# Patient Record
Sex: Female | Born: 1970 | Race: White | Hispanic: No | Marital: Married | Smoking: Never smoker
Health system: Southern US, Community
[De-identification: ages and names within clinical notes are randomized; demographics above are authoritative.]

## PROBLEM LIST (undated history)

## (undated) DIAGNOSIS — J309 Allergic rhinitis, unspecified: Secondary | ICD-10-CM

## (undated) DIAGNOSIS — M2550 Pain in unspecified joint: Secondary | ICD-10-CM

## (undated) HISTORY — DX: Pain in unspecified joint: M25.50

## (undated) HISTORY — PX: TUBAL LIGATION: SHX77

## (undated) HISTORY — DX: Allergic rhinitis, unspecified: J30.9

## (undated) HISTORY — PX: BREAST REDUCTION SURGERY: SHX8

---

## 2011-03-16 ENCOUNTER — Emergency Department (HOSPITAL_COMMUNITY)
Admission: EM | Admit: 2011-03-16 | Discharge: 2011-03-16 | Disposition: A | Discharge status: Home / Self Care | Payer: Self-pay | Attending: Emergency Medicine | Admitting: Emergency Medicine

## 2011-03-16 ENCOUNTER — Encounter: Payer: Self-pay | Admitting: Emergency Medicine

## 2011-03-16 DIAGNOSIS — Z203 Contact with and (suspected) exposure to rabies: Secondary | ICD-10-CM | POA: Insufficient documentation

## 2011-03-16 MED ORDER — RABIES IMMUNE GLOBULIN 150 UNIT/ML IM INJ
20.0000 [IU]/kg | INJECTION | Freq: Once | INTRAMUSCULAR | Status: AC
  Administered 2011-03-16: 1500 [IU] via INTRAMUSCULAR

## 2011-03-16 MED ORDER — RABIES VACCINE, PCEC IM SUSR
1.0000 mL | Freq: Once | INTRAMUSCULAR | Status: AC
  Administered 2011-03-16: 1 mL via INTRAMUSCULAR

## 2011-03-16 NOTE — ED Notes (Signed)
Pt dog killed a rabid raccoon Saturday.

## 2011-03-19 ENCOUNTER — Encounter (HOSPITAL_COMMUNITY): Payer: Self-pay | Attending: Family Medicine

## 2011-03-19 DIAGNOSIS — Z203 Contact with and (suspected) exposure to rabies: Secondary | ICD-10-CM | POA: Insufficient documentation

## 2011-03-19 DIAGNOSIS — Z23 Encounter for immunization: Secondary | ICD-10-CM | POA: Insufficient documentation

## 2011-03-19 MED ORDER — RABIES VACCINE, PCEC IM SUSR
1.0000 mL | Freq: Once | INTRAMUSCULAR | Status: AC
  Administered 2011-03-19: 1 mL via INTRAMUSCULAR
  Filled 2011-03-19: qty 1

## 2011-03-19 NOTE — Progress Notes (Signed)
Rabivert 1 cc given IM Z-track lt deltoid. Tolerated well.

## 2011-03-22 NOTE — ED Provider Notes (Signed)
History     CSN: [id number] Arrival date & time: 03/16/2011  5:35 PM  Chief Complaint  Patient presents with  . Rabies Injection   HPI Comments: Patient and her son present today for rabies immunoglobulin and vaccine.  Their 2 dachsunds  Attacked a raccoon 3 days ago,  Midday sustaining lacerations and abrasions to face.  The raccoon has tested positive for rabies,  The dogs are also up to date on their vaccines. Both son and mother were exposed to blood and saliva from the dogs and/or raccoon.  They both deny any injury,  Such as punctures,  Abrasion or laceration.  They have no complaint of symptom and both feel well today.   The history is provided by the patient.    History reviewed. No pertinent past medical history.  Past Surgical History  Procedure Date  . Tubal ligation     History reviewed. No pertinent family history.  History  Substance Use Topics  . Smoking status: Never Smoker   . Smokeless tobacco: Not on file  . Alcohol Use: No    OB History    Grav Para Term Preterm Abortions TAB SAB Ect Mult Living                  Review of Systems  All other systems reviewed and are negative.    Physical Exam  BP 139/90  Pulse 80  Temp(Src) 99 F (37.2 C) (Oral)  Resp 18  Ht 5\' 8"  (1.727 m)  Wt 165 lb (74.844 kg)  BMI 25.09 kg/m2  SpO2 99%  LMP 03/09/2011  Physical Exam  Vitals reviewed. Constitutional: She is oriented to person, place, and time. She appears well-developed and well-nourished.  HENT:  Head: Normocephalic and atraumatic.  Eyes: Conjunctivae are normal.  Neck: Normal range of motion.  Cardiovascular: Normal rate and regular rhythm.   Pulmonary/Chest: Effort normal.  Musculoskeletal: Normal range of motion.  Neurological: She is alert and oriented to person, place, and time.  Skin: Skin is warm and dry.  Psychiatric: She has a normal mood and affect.    ED Course  Procedures  MDM Case discussed with Dr. Ignacia Palma,  Both patient and  mother started on rabies series including immunoglobulin and vaccine today.  Set up with short stay for remaining vaccine ,  Schedule given.    Medical screening examination/treatment/procedure(s) were performed by non-physician practitioner and as supervising physician I was immediately available for consultation/collaboration. Osvaldo Human, M.D.     Candis Musa, PA 03/22/11 1227  Carleene Cooper III, MD 03/22/11 2001

## 2011-03-23 ENCOUNTER — Encounter (HOSPITAL_COMMUNITY): Payer: Self-pay

## 2011-03-23 MED ORDER — RABIES VACCINE, PCEC IM SUSR
1.0000 mL | Freq: Once | INTRAMUSCULAR | Status: AC
  Administered 2011-03-23: 1 mL via INTRAMUSCULAR
  Filled 2011-03-23: qty 1

## 2011-03-30 ENCOUNTER — Ambulatory Visit (HOSPITAL_COMMUNITY): Payer: Self-pay

## 2011-03-31 ENCOUNTER — Encounter (HOSPITAL_COMMUNITY): Payer: Self-pay

## 2011-03-31 DIAGNOSIS — Z203 Contact with and (suspected) exposure to rabies: Secondary | ICD-10-CM

## 2011-03-31 MED ORDER — RABIES VACCINE, PCEC IM SUSR
1.0000 mL | Freq: Once | INTRAMUSCULAR | Status: AC
  Administered 2011-03-31: 1 mL via INTRAMUSCULAR
  Filled 2011-03-31: qty 1

## 2011-03-31 NOTE — Progress Notes (Signed)
Tolerated rabavert well.

## 2019-03-16 ENCOUNTER — Other Ambulatory Visit: Payer: Self-pay

## 2019-03-16 DIAGNOSIS — Z20822 Contact with and (suspected) exposure to covid-19: Secondary | ICD-10-CM

## 2019-03-18 LAB — NOVEL CORONAVIRUS, NAA: SARS-CoV-2, NAA: NOT DETECTED

## 2019-05-29 ENCOUNTER — Other Ambulatory Visit: Payer: Self-pay

## 2019-05-29 DIAGNOSIS — Z20822 Contact with and (suspected) exposure to covid-19: Secondary | ICD-10-CM

## 2019-05-31 ENCOUNTER — Telehealth: Payer: Self-pay | Admitting: Family Medicine

## 2019-05-31 LAB — NOVEL CORONAVIRUS, NAA: SARS-CoV-2, NAA: NOT DETECTED

## 2019-05-31 NOTE — Telephone Encounter (Signed)
Pt aware covid lab test negative, not detected °

## 2019-12-25 ENCOUNTER — Ambulatory Visit (INDEPENDENT_AMBULATORY_CARE_PROVIDER_SITE_OTHER): Payer: Self-pay | Admitting: Dermatology

## 2019-12-25 ENCOUNTER — Other Ambulatory Visit: Payer: Self-pay

## 2019-12-25 DIAGNOSIS — L988 Other specified disorders of the skin and subcutaneous tissue: Secondary | ICD-10-CM

## 2019-12-25 NOTE — Progress Notes (Signed)
   Follow-Up Visit   Subjective  Janet Nielsen is a 49 y.o. 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   The following portions of the chart were reviewed this encounter and updated as appropriate:  Allergies  Meds  Problems  Med Hx  Surg Hx  Fam Hx      Review of Systems:  No other skin or systemic complaints except as noted in HPI or Assessment and Plan.  Objective  Well appearing patient in no apparent distress; mood and affect are within normal limits.  A focused examination was performed including face. Relevant physical exam findings are noted in the Assessment and Plan.  Objective  face: Rhytides and volume loss.   Images                     Assessment & Plan  Elastosis of skin face  Botox 41.25 units injected as to: -Frown complex 22.5 units -Botox comma 1.25 units -Forehead 7.5 units -Crows feet 5 units each side for a total of 10units  Intralesional injection - face Location: Frown complex, forehead, crows feet, botox comma on Left  Informed consent: Discussed risks (infection, pain, bleeding, bruising, swelling, allergic reaction, paralysis of nearby muscles, eyelid droop, double vision, neck weakness, difficulty breathing, headache, undesirable cosmetic result, and need for additional treatment) and benefits of the procedure, as well as the alternatives.  Informed consent was obtained.  Preparation: The area was cleansed with alcohol.  Procedure Details:  Botox was injected into the dermis with a 30-gauge needle. Pressure applied to any bleeding. Ice packs offered for swelling.  Lot Number:  [id number] Expiration:  02/2022  Total Units Injected:  41.25  Plan: Patient was instructed to remain upright for 4 hours. Patient was instructed to avoid massaging the face and avoid vigorous exercise for the rest of the day. Tylenol may be used for headache.  Allow 2 weeks  before returning to clinic for additional dosing as needed. Patient will call for any problems.   Return in about 1 month (around 01/25/2020) for Botox.  I, Othelia Pulling, RMA, am acting as scribe for Sarina Ser, MD .  Documentation: I have reviewed the above documentation for accuracy and completeness, and I agree with the above.  Sarina Ser, MD

## 2020-01-04 ENCOUNTER — Encounter: Payer: Self-pay | Admitting: Dermatology

## 2020-04-15 ENCOUNTER — Ambulatory Visit (INDEPENDENT_AMBULATORY_CARE_PROVIDER_SITE_OTHER): Payer: Self-pay | Admitting: Dermatology

## 2020-04-15 ENCOUNTER — Other Ambulatory Visit: Payer: Self-pay

## 2020-04-15 DIAGNOSIS — L988 Other specified disorders of the skin and subcutaneous tissue: Secondary | ICD-10-CM

## 2020-04-15 NOTE — Progress Notes (Signed)
   Follow-Up Visit   Subjective  Janet Nielsen is a 49 y.o. female who presents for the following: Facial Elastosis (face, pt presents for Botox, last botox 12/25/2019).  The following portions of the chart were reviewed this encounter and updated as appropriate:  Allergies  Meds  Problems  Med Hx  Surg Hx  Fam Hx     Review of Systems:  No other skin or systemic complaints except as noted in HPI or Assessment and Plan.  Objective  Well appearing patient in no apparent distress; mood and affect are within normal limits.  A focused examination was performed including lower extremities, including the legs, feet, toes, and toenails and face. Relevant physical exam findings are noted in the Assessment and Plan.  Objective  face: Rhytides and volume loss.   Images     Assessment & Plan  Elastosis of skin face  Botox 41.25 units injected as to: -Frown complex 22.5 units -Botox comma 1.25 units -Forehead 7.5 units -Crows feet 5 units each side for a total of 10units  Will plan to increase frown complex to 30 units on f/u  Intralesional injection - face Location: Frown complex, forehead, botox comma, crows feet  Informed consent: Discussed risks (infection, pain, bleeding, bruising, swelling, allergic reaction, paralysis of nearby muscles, eyelid droop, double vision, neck weakness, difficulty breathing, headache, undesirable cosmetic result, and need for additional treatment) and benefits of the procedure, as well as the alternatives.  Informed consent was obtained.  Preparation: The area was cleansed with alcohol.  Procedure Details:  Botox was injected into the dermis with a 30-gauge needle. Pressure applied to any bleeding. Ice packs offered for swelling.  Lot Number:  [id number] Expiration:  06/2022  Total Units Injected:  41.25  Plan: Patient was instructed to remain upright for 4 hours. Patient was instructed to avoid massaging the face and avoid vigorous  exercise for the rest of the day. Tylenol may be used for headache.  Allow 2 weeks before returning to clinic for additional dosing as needed. Patient will call for any problems.   Return in about 3 months (around 07/15/2020) for 3-104m Botox.   I, Othelia Pulling, RMA, am acting as scribe for Sarina Ser, MD .  Documentation: I have reviewed the above documentation for accuracy and completeness, and I agree with the above.  Sarina Ser, MD

## 2020-04-19 ENCOUNTER — Encounter: Payer: Self-pay | Admitting: Dermatology

## 2020-06-08 ENCOUNTER — Other Ambulatory Visit: Payer: Self-pay

## 2020-06-08 ENCOUNTER — Encounter: Payer: Self-pay | Admitting: Emergency Medicine

## 2020-06-08 ENCOUNTER — Ambulatory Visit
Admission: EM | Admit: 2020-06-08 | Discharge: 2020-06-08 | Disposition: A | Discharge status: Home / Self Care | Payer: PRIVATE HEALTH INSURANCE | Attending: Emergency Medicine | Admitting: Emergency Medicine

## 2020-06-08 DIAGNOSIS — J029 Acute pharyngitis, unspecified: Secondary | ICD-10-CM | POA: Diagnosis not present

## 2020-06-08 LAB — POCT RAPID STREP A (OFFICE): Rapid Strep A Screen: NEGATIVE

## 2020-06-08 MED ORDER — LIDOCAINE VISCOUS HCL 2 % MT SOLN: 10.0000 mL | OROMUCOSAL | 1 refills | Status: DC | PRN

## 2020-06-08 NOTE — ED Triage Notes (Signed)
Patient states that her covid test was negative yesterday- sore throat hx of strep.

## 2020-06-08 NOTE — ED Provider Notes (Signed)
[place]   [id number] 06/08/20 Arrival Time: 1016  JE:HUDJ THROAT  SUBJECTIVE: History from: patient.  Janet Nielsen is a 49 y.o. female w who presented to the urgent care for complaint of sore throat for the past few days.  Reports history of strep.  States she has tested negative for COVID-19.  Denies sick exposure to strep, flu or mono, or precipitating event.  Has tried OTC medication without relief.  Symptoms are made worse with swallowing, but tolerating liquids and own secretions without difficulty.  Reports/ denies previous symptoms in the past.  Denies fever, chills, fatigue, ear pain, sinus pain, rhinorrhea, nasal congestion, cough, SOB, wheezing, chest pain, nausea, rash, changes in bowel or bladder habits.     ROS: As per HPI.  All other pertinent ROS negative.      History reviewed. No pertinent past medical history. Past Surgical History:  Procedure Laterality Date  . TUBAL LIGATION     No Known Allergies No current facility-administered medications on file prior to encounter.   Current Outpatient Medications on File Prior to Encounter  Medication Sig Dispense Refill  . ibuprofen (ADVIL,MOTRIN) 200 MG tablet Take 200-800 mg by mouth once as needed. For headache pain     . naproxen sodium (ANAPROX) 220 MG tablet Take 440 mg by mouth once as needed. For pain      Social History   Socioeconomic History  . Marital status: Married    Spouse name: Not on file  . Number of children: Not on file  . Years of education: Not on file  . Highest education level: Not on file  Occupational History  . Not on file  Tobacco Use  . Smoking status: Never Smoker  . Smokeless tobacco: Never Used  Substance and Sexual Activity  . Alcohol use: No  . Drug use: No  . Sexual activity: Not on file  Other Topics Concern  . Not on file  Social History Narrative  . Not on file   Social Determinants of Health   Financial Resource Strain:   . Difficulty of Paying  Living Expenses: Not on file  Food Insecurity:   . Worried About Charity fundraiser in the Last Year: Not on file  . Ran Out of Food in the Last Year: Not on file  Transportation Needs:   . Lack of Transportation (Medical): Not on file  . Lack of Transportation (Non-Medical): Not on file  Physical Activity:   . Days of Exercise per Week: Not on file  . Minutes of Exercise per Session: Not on file  Stress:   . Feeling of Stress : Not on file  Social Connections:   . Frequency of Communication with Friends and Family: Not on file  . Frequency of Social Gatherings with Friends and Family: Not on file  . Attends Religious Services: Not on file  . Active Member of Clubs or Organizations: Not on file  . Attends Archivist Meetings: Not on file  . Marital Status: Not on file  Intimate Partner Violence:   . Fear of Current or Ex-Partner: Not on file  . Emotionally Abused: Not on file  . Physically Abused: Not on file  . Sexually Abused: Not on file   No family history on file.  OBJECTIVE:  Vitals:   06/08/20 1034  BP: (!) 140/95  Pulse: 95  Resp: 16  Temp: 98.3 F (36.8 C)  SpO2: 99%     General appearance: alert; appears fatigued, but  nontoxic, speaking in full sentences and managing own secretions HEENT: NCAT; Ears: EACs clear, TMs pearly gray with visible cone of light, without erythema; Eyes: PERRL, EOMI grossly; Nose: no obvious rhinorrhea; Throat: oropharynx clear, tonsils 1+ and mildly erythematous without white tonsillar exudates, uvula midline Neck: supple without LAD Lungs: CTA bilaterally without adventitious breath sounds; cough absent Heart: regular rate and rhythm.  Radial pulses 2+ symmetrical bilaterally Skin: warm and dry Psychological: alert and cooperative; normal mood and affect  LABS: Results for orders placed or performed during the hospital encounter of 06/08/20 (from the past 24 hour(s))  POCT rapid strep A     Status: None   Collection  Time: 06/08/20 10:56 AM  Result Value Ref Range   Rapid Strep A Screen Negative Negative     ASSESSMENT & PLAN:  1. Sore throat     Meds ordered this encounter  Medications  . lidocaine (XYLOCAINE) 2 % solution    Sig: Use as directed 10 mLs in the mouth or throat as needed for mouth pain.    Dispense:  100 mL    Refill:  1   Discharge instructions  Strep test negative, will send out for culture and we will call you with results Get plenty of rest and push fluids Viscous lidocaine prescribed.  This is an oral solution you can swish, and gargle as needed for symptomatic relief of sore throat.  Do not exceed 8 doses in a 24 hour period.  Do not use prior to eating, as this will numb your entire mouth.   Drink warm or cool liquids, use throat lozenges, or popsicles to help alleviate symptoms Take OTC ibuprofen or tylenol as needed for pain Follow up with PCP if symptoms persists Return or go to ER if patient has any new or worsening symptoms such as fever, chills, nausea, vomiting, worsening sore throat, cough, abdominal pain, chest pain, changes in bowel or bladder habits, etc...  Reviewed expectations re: course of current medical issues. Questions answered. Outlined signs and symptoms indicating need for more acute intervention. Patient verbalized understanding. After Visit Summary given.         Emerson Monte, [place] 06/08/20 1116

## 2020-06-08 NOTE — Discharge Instructions (Addendum)
Strep test negative, will send out for culture and we will call you with results Get plenty of rest and push fluids Viscous lidocaine prescribed.  This is an oral solution you can swish, and gargle as needed for symptomatic relief of sore throat.  Do not exceed 8 doses in a 24 hour period.  Do not use prior to eating, as this will numb your entire mouth.   Drink warm or cool liquids, use throat lozenges, or popsicles to help alleviate symptoms Take OTC ibuprofen or tylenol as needed for pain Follow up with PCP if symptoms persists Return or go to ER if patient has any new or worsening symptoms such as fever, chills, nausea, vomiting, worsening sore throat, cough, abdominal pain, chest pain, changes in bowel or bladder habits, etc..Marland Kitchen

## 2020-06-09 LAB — CULTURE, GROUP A STREP (THRC)

## 2020-06-11 LAB — CULTURE, GROUP A STREP (THRC)

## 2020-08-19 ENCOUNTER — Ambulatory Visit: Payer: Self-pay | Admitting: Dermatology

## 2020-08-21 ENCOUNTER — Ambulatory Visit: Payer: Self-pay | Admitting: Dermatology

## 2020-09-09 ENCOUNTER — Ambulatory Visit (INDEPENDENT_AMBULATORY_CARE_PROVIDER_SITE_OTHER): Payer: Self-pay | Admitting: Dermatology

## 2020-09-09 ENCOUNTER — Other Ambulatory Visit: Payer: Self-pay

## 2020-09-09 DIAGNOSIS — L988 Other specified disorders of the skin and subcutaneous tissue: Secondary | ICD-10-CM

## 2020-09-09 NOTE — Progress Notes (Signed)
   Follow-Up Visit   Subjective  Janet Nielsen is a 50 y.o. female who presents for the following: Facial Elastosis (Patient is here today for Botox injections. She would also like to discuss treatment options for facial elastosis of the lower half of her face. ).  The following portions of the chart were reviewed this encounter and updated as appropriate:   Tobacco  Allergies  Meds  Problems  Med Hx  Surg Hx  Fam Hx     Review of Systems:  No other skin or systemic complaints except as noted in HPI or Assessment and Plan.  Objective  Well appearing patient in no apparent distress; mood and affect are within normal limits.  A focused examination was performed including the face. Relevant physical exam findings are noted in the Assessment and Plan.  Objective  Face: Rhytides and volume loss.   Images         Assessment & Plan  Elastosis of skin Face  Botox 49.25 units injected as marked:   - Frown complex 22.5 units - L Botox comma 1.25 units - Forehead 7.5 units  - Crow's feet 5 units each  - DAO's 4 units each   Consider increasing frown complex to 30 units.   Discussed fillers to the oral commissures and nasolabial creases.   Botox Injection - Face Location: See attached image  Informed consent: Discussed risks (infection, pain, bleeding, bruising, swelling, allergic reaction, paralysis of nearby muscles, eyelid droop, double vision, neck weakness, difficulty breathing, headache, undesirable cosmetic result, and need for additional treatment) and benefits of the procedure, as well as the alternatives.  Informed consent was obtained.  Preparation: The area was cleansed with alcohol.  Procedure Details:  Botox was injected into the dermis with a 30-gauge needle. Pressure applied to any bleeding. Ice packs offered for swelling.  Lot Number:  [id number] Expiration:  10/2022  Total Units Injected:  49.25  Plan: Patient was instructed to remain upright for  4 hours. Patient was instructed to avoid massaging the face and avoid vigorous exercise for the rest of the day. Tylenol may be used for headache.  Allow 2 weeks before returning to clinic for additional dosing as needed. Patient will call for any problems.   Return in about 3 months (around 12/08/2020) for cosmetic - Botox.  Luther Redo, CMA, am acting as scribe for Sarina Ser, MD .  Documentation: I have reviewed the above documentation for accuracy and completeness, and I agree with the above.  Sarina Ser, MD

## 2020-09-12 ENCOUNTER — Encounter: Payer: Self-pay | Admitting: Dermatology

## 2020-09-16 ENCOUNTER — Other Ambulatory Visit: Payer: PRIVATE HEALTH INSURANCE

## 2020-09-16 DIAGNOSIS — Z20822 Contact with and (suspected) exposure to covid-19: Secondary | ICD-10-CM

## 2020-09-17 LAB — SPECIMEN STATUS REPORT

## 2020-09-17 LAB — SARS-COV-2, NAA 2 DAY TAT

## 2020-09-17 LAB — NOVEL CORONAVIRUS, NAA: SARS-CoV-2, NAA: DETECTED — AB

## 2020-09-25 ENCOUNTER — Other Ambulatory Visit: Payer: Self-pay

## 2020-09-25 ENCOUNTER — Ambulatory Visit
Admission: EM | Admit: 2020-09-25 | Discharge: 2020-09-25 | Disposition: A | Discharge status: Home / Self Care | Payer: PRIVATE HEALTH INSURANCE | Attending: Family Medicine | Admitting: Family Medicine

## 2020-09-25 DIAGNOSIS — J029 Acute pharyngitis, unspecified: Secondary | ICD-10-CM | POA: Diagnosis not present

## 2020-09-25 LAB — POCT RAPID STREP A (OFFICE): Rapid Strep A Screen: NEGATIVE

## 2020-09-25 NOTE — Discharge Instructions (Addendum)
Your rapid strep test is negative.  A throat culture is pending; we will call you if it is positive requiring treatment.    Follow up with this office or with primary care if symptoms are persisting.  Follow up in the ER for high fever, trouble swallowing, trouble breathing, other concerning symptoms.

## 2020-09-25 NOTE — ED Triage Notes (Signed)
Pt presents with sore throat that began Tuesday , had covid over week ago , daughter now has strep

## 2020-09-25 NOTE — ED Provider Notes (Signed)
[place]   [id number] 09/25/20 Arrival Time: 7124  PY:KDXI THROAT  SUBJECTIVE: History from: patient.  Janet Nielsen is a 50 y.o. female who presents with abrupt onset of sore throat for the last 2 days. Was diagnosed with Covid 10 days ago. Denies sick exposure to Covid, strep, flu or mono, or precipitating event. Has tried tylenol and motrin without relief. Has not completed Covid vaccines. Symptoms are made worse with swallowing, but tolerating liquids and own secretions without difficulty. Denies previous symptoms in the past.     Denies fever, chills, fatigue, ear pain, sinus pain, rhinorrhea, nasal congestion, cough, SOB, wheezing, chest pain, nausea, rash, changes in bowel or bladder habits.    ROS: As per HPI.  All other pertinent ROS negative.     History reviewed. No pertinent past medical history. Past Surgical History:  Procedure Laterality Date  . TUBAL LIGATION     No Known Allergies No current facility-administered medications on file prior to encounter.   Current Outpatient Medications on File Prior to Encounter  Medication Sig Dispense Refill  . ibuprofen (ADVIL,MOTRIN) 200 MG tablet Take 200-800 mg by mouth once as needed. For headache pain     . lidocaine (XYLOCAINE) 2 % solution Use as directed 10 mLs in the mouth or throat as needed for mouth pain. 100 mL 1  . naproxen sodium (ANAPROX) 220 MG tablet Take 440 mg by mouth once as needed. For pain      Social History   Socioeconomic History  . Marital status: Married    Spouse name: Not on file  . Number of children: Not on file  . Years of education: Not on file  . Highest education level: Not on file  Occupational History  . Not on file  Tobacco Use  . Smoking status: Never Smoker  . Smokeless tobacco: Never Used  Substance and Sexual Activity  . Alcohol use: No  . Drug use: No  . Sexual activity: Not on file  Other Topics Concern  . Not on file  Social History Narrative  . Not on  file   Social Determinants of Health   Financial Resource Strain: Not on file  Food Insecurity: Not on file  Transportation Needs: Not on file  Physical Activity: Not on file  Stress: Not on file  Social Connections: Not on file  Intimate Partner Violence: Not on file   History reviewed. No pertinent family history.  OBJECTIVE:  Vitals:   09/25/20 1356  BP: 127/82  Pulse: 85  Resp: 18  Temp: 98.8 F (37.1 C)  SpO2: 98%     General appearance: alert; appears fatigued, but nontoxic, speaking in full sentences and managing own secretions HEENT: NCAT; Ears: EACs clear, TMs pearly gray with visible cone of light, without erythema; Eyes: PERRL, EOMI grossly; Nose: no obvious rhinorrhea; Throat: oropharynx clear, tonsils 1+ and mildly erythematous without white tonsillar exudates, uvula midline Neck: supple without LAD Lungs: CTA bilaterally without adventitious breath sounds; cough absent Heart: regular rate and rhythm.  Radial pulses 2+ symmetrical bilaterally Skin: warm and dry Psychological: alert and cooperative; normal mood and affect  LABS: Results for orders placed or performed during the hospital encounter of 09/25/20 (from the past 24 hour(s))  POCT rapid strep A     Status: None   Collection Time: 09/25/20  2:08 PM  Result Value Ref Range   Rapid Strep A Screen Negative Negative     ASSESSMENT & PLAN:  1. Viral pharyngitis  Strep test negative, will send out for culture and we will call you with results Declines test for mono at this time Get plenty of rest and push fluids Take OTC Zyrtec and use chloraseptic spray as needed for throat pain. Drink warm or cool liquids, use throat lozenges, or popsicles to help alleviate symptoms Take OTC ibuprofen or tylenol as needed for pain Follow up with PCP if symptoms persists Return or go to ER if patient has any new or worsening symptoms such as fever, chills, nausea, vomiting, worsening sore throat, cough, abdominal  pain, chest pain, changes in bowel or bladder habits  Reviewed expectations re: course of current medical issues. Questions answered. Outlined signs and symptoms indicating need for more acute intervention. Patient verbalized understanding. After Visit Summary given.          Faustino Congress, NP 09/25/20 1446

## 2020-09-26 LAB — CULTURE, GROUP A STREP (THRC)

## 2020-09-28 LAB — CULTURE, GROUP A STREP (THRC)

## 2020-10-01 ENCOUNTER — Other Ambulatory Visit (HOSPITAL_COMMUNITY): Payer: Self-pay | Admitting: Family Medicine

## 2020-10-01 ENCOUNTER — Other Ambulatory Visit: Payer: Self-pay

## 2020-10-01 ENCOUNTER — Ambulatory Visit (HOSPITAL_COMMUNITY)
Admission: RE | Admit: 2020-10-01 | Discharge: 2020-10-01 | Disposition: A | Discharge status: Home / Self Care | Payer: PRIVATE HEALTH INSURANCE | Source: Ambulatory Visit | Attending: Family Medicine | Admitting: Family Medicine

## 2020-10-01 DIAGNOSIS — M25552 Pain in left hip: Secondary | ICD-10-CM | POA: Insufficient documentation

## 2020-12-16 ENCOUNTER — Ambulatory Visit (INDEPENDENT_AMBULATORY_CARE_PROVIDER_SITE_OTHER): Payer: Self-pay | Admitting: Dermatology

## 2020-12-16 ENCOUNTER — Other Ambulatory Visit: Payer: Self-pay

## 2020-12-16 DIAGNOSIS — L988 Other specified disorders of the skin and subcutaneous tissue: Secondary | ICD-10-CM

## 2020-12-16 NOTE — Progress Notes (Signed)
   Follow-Up Visit   Subjective  Janet Nielsen is a 50 y.o. female who presents for the following: Facial Elastosis (Botox today).  The following portions of the chart were reviewed this encounter and updated as appropriate:   Tobacco  Allergies  Meds  Problems  Med Hx  Surg Hx  Fam Hx     Review of Systems:  No other skin or systemic complaints except as noted in HPI or Assessment and Plan.  Objective  Well appearing patient in no apparent distress; mood and affect are within normal limits.  A focused examination was performed including face. Relevant physical exam findings are noted in the Assessment and Plan.  Objective  Face: Rhytides and volume loss.   Images     Assessment & Plan  Elastosis of skin Face  Botox 56.75 units injected as marked:    - Frown complex 30 units - L Botox comma 1.25 units - Forehead 7.5 units  - Crow's feet 5 units each  - DAO's 4 units each   Botox Injection - Face Location: See attached image  Informed consent: Discussed risks (infection, pain, bleeding, bruising, swelling, allergic reaction, paralysis of nearby muscles, eyelid droop, double vision, neck weakness, difficulty breathing, headache, undesirable cosmetic result, and need for additional treatment) and benefits of the procedure, as well as the alternatives.  Informed consent was obtained.  Preparation: The area was cleansed with alcohol.  Procedure Details:  Botox was injected into the dermis with a 30-gauge needle. Pressure applied to any bleeding. Ice packs offered for swelling.  Lot Number:  [id number] Expiration:  01/2023  Total Units Injected:  56.75  Plan: Patient was instructed to remain upright for 4 hours. Patient was instructed to avoid massaging the face and avoid vigorous exercise for the rest of the day. Tylenol may be used for headache.  Allow 2 weeks before returning to clinic for additional dosing as needed. Patient will call for any  problems.   Return for Botox in 3-4 months.  I, Ashok Cordia, CMA, am acting as scribe for Sarina Ser, MD .  Documentation: I have reviewed the above documentation for accuracy and completeness, and I agree with the above.  Sarina Ser, MD

## 2020-12-18 ENCOUNTER — Encounter: Payer: Self-pay | Admitting: Dermatology

## 2021-03-19 ENCOUNTER — Ambulatory Visit
Admission: EM | Admit: 2021-03-19 | Discharge: 2021-03-19 | Disposition: A | Discharge status: Home / Self Care | Payer: No Typology Code available for payment source | Attending: Emergency Medicine | Admitting: Emergency Medicine

## 2021-03-19 ENCOUNTER — Encounter: Payer: Self-pay | Admitting: Emergency Medicine

## 2021-03-19 DIAGNOSIS — J040 Acute laryngitis: Secondary | ICD-10-CM | POA: Diagnosis not present

## 2021-03-19 MED ORDER — PREDNISONE 20 MG PO TABS: 20.0000 mg | ORAL_TABLET | Freq: Two times a day (BID) | ORAL | 0 refills | Status: AC

## 2021-03-19 NOTE — Discharge Instructions (Addendum)
COVID testing ordered.  It will take between 5-7 days for test results.  Someone will contact you regarding abnormal results.    In the meantime: You should remain isolated in your home for 5 days from symptom onset AND greater than 72 hours after symptoms resolution (absence of fever without the use of fever-reducing medication and improvement in respiratory symptoms), whichever is longer Get plenty of rest and push fluids Prednisone prescribed.  If needed for laryngitis  Use OTC zyrtec for nasal congestion, runny nose, and/or sore throat Use OTC flonase for nasal congestion and runny nose Use medications daily for symptom relief Use OTC medications like ibuprofen or tylenol as needed fever or pain Call or go to the ED if you have any new or worsening symptoms such as fever, cough, shortness of breath, chest tightness, chest pain, turning blue, changes in mental status, etc..Marland Kitchen

## 2021-03-19 NOTE — ED Provider Notes (Signed)
[place]   [id number] 03/19/21 Arrival Time: 0800   CC: COVID symptoms  SUBJECTIVE: History from: patient.  Janet Nielsen is a 50 y.o. female who presents with hoarse voice and sinus congestion that began this morning.  Husband recently diagnosed with bronchitis and sinus infection.  Took at home covid test that was negative.  Has NOT tried OTC medications.  Symptoms are made worse with speaking.  Reports previous symptoms in the past.   Denies fever, chills, sinus pain, rhinorrhea, sore throat, SOB, wheezing, chest pain, nausea, changes in bowel or bladder habits.    ROS: As per HPI.  All other pertinent ROS negative.     History reviewed. No pertinent past medical history. Past Surgical History:  Procedure Laterality Date   TUBAL LIGATION     No Known Allergies No current facility-administered medications on file prior to encounter.   Current Outpatient Medications on File Prior to Encounter  Medication Sig Dispense Refill   ibuprofen (ADVIL,MOTRIN) 200 MG tablet Take 200-800 mg by mouth once as needed. For headache pain      lidocaine (XYLOCAINE) 2 % solution Use as directed 10 mLs in the mouth or throat as needed for mouth pain. 100 mL 1   naproxen sodium (ANAPROX) 220 MG tablet Take 440 mg by mouth once as needed. For pain      Social History   Socioeconomic History   Marital status: Married    Spouse name: Not on file   Number of children: Not on file   Years of education: Not on file   Highest education level: Not on file  Occupational History   Not on file  Tobacco Use   Smoking status: Never   Smokeless tobacco: Never  Substance and Sexual Activity   Alcohol use: No   Drug use: No   Sexual activity: Not on file  Other Topics Concern   Not on file  Social History Narrative   Not on file   Social Determinants of Health   Financial Resource Strain: Not on file  Food Insecurity: Not on file  Transportation Needs: Not on file  Physical  Activity: Not on file  Stress: Not on file  Social Connections: Not on file  Intimate Partner Violence: Not on file   No family history on file.  OBJECTIVE:  Vitals:   03/19/21 0821  BP: 130/85  Pulse: 78  Resp: 17  Temp: 98.2 F (36.8 C)  TempSrc: Oral  SpO2: 98%     General appearance: alert; appears fatigued, but nontoxic; speaking in full sentences and tolerating own secretions; voice hoarse HEENT: NCAT; Ears: EACs clear, TMs pearly gray; Eyes: PERRL.  EOM grossly intact. Nose: nares patent without rhinorrhea, Throat: oropharynx clear, tonsils non erythematous or enlarged, uvula midline  Neck: supple without LAD Lungs: unlabored respirations, symmetrical air entry; cough: absent; no respiratory distress; CTAB Heart: regular rate and rhythm.   Skin: warm and dry Psychological: alert and cooperative; normal mood and affect  ASSESSMENT & PLAN:  1. Laryngitis     Meds ordered this encounter  Medications   predniSONE (DELTASONE) 20 MG tablet    Sig: Take 1 tablet (20 mg total) by mouth 2 (two) times daily with a meal for 5 days.    Dispense:  10 tablet    Refill:  0    Order Specific Question:   Supervising Provider    Answer:   Raylene Everts [id number]    COVID testing ordered.  It will  take between 5-7 days for test results.  Someone will contact you regarding abnormal results.    In the meantime: You should remain isolated in your home for 5 days from symptom onset AND greater than 72 hours after symptoms resolution (absence of fever without the use of fever-reducing medication and improvement in respiratory symptoms), whichever is longer Get plenty of rest and push fluids Prednisone prescribed.  If needed for laryngitis  Use OTC zyrtec for nasal congestion, runny nose, and/or sore throat Use OTC flonase for nasal congestion and runny nose Use medications daily for symptom relief Use OTC medications like ibuprofen or tylenol as needed fever or pain Call or  go to the ED if you have any new or worsening symptoms such as fever, cough, shortness of breath, chest tightness, chest pain, turning blue, changes in mental status, etc...   Reviewed expectations re: course of current medical issues. Questions answered. Outlined signs and symptoms indicating need for more acute intervention. Patient verbalized understanding. After Visit Summary given.          Lestine Box, PA-C 03/19/21 0840

## 2021-03-19 NOTE — ED Triage Notes (Signed)
Pt states she woke up this morning and had lost her voice.  Had green mucous when blowing her nose.  Neg at home covid test.

## 2021-03-20 LAB — COVID-19, FLU A+B NAA
Influenza A, NAA: NOT DETECTED
Influenza B, NAA: NOT DETECTED
SARS-CoV-2, NAA: NOT DETECTED

## 2021-03-31 ENCOUNTER — Ambulatory Visit: Payer: Self-pay | Admitting: Dermatology

## 2021-03-31 ENCOUNTER — Other Ambulatory Visit: Payer: Self-pay

## 2021-03-31 ENCOUNTER — Ambulatory Visit (INDEPENDENT_AMBULATORY_CARE_PROVIDER_SITE_OTHER): Payer: Self-pay | Admitting: Dermatology

## 2021-03-31 DIAGNOSIS — L988 Other specified disorders of the skin and subcutaneous tissue: Secondary | ICD-10-CM

## 2021-03-31 NOTE — Patient Instructions (Signed)

## 2021-03-31 NOTE — Progress Notes (Signed)
   Follow-Up Visit   Subjective  Janet Nielsen is a 50 y.o. female who presents for the following: Facial Elastosis (Botox today).  The following portions of the chart were reviewed this encounter and updated as appropriate:   Tobacco  Allergies  Meds  Problems  Med Hx  Surg Hx  Fam Hx     Review of Systems:  No other skin or systemic complaints except as noted in HPI or Assessment and Plan.  Objective  Well appearing patient in no apparent distress; mood and affect are within normal limits.  A focused examination was performed including face. Relevant physical exam findings are noted in the Assessment and Plan.  Head - Anterior (Face) Rhytides and volume loss.           Assessment & Plan  Elastosis of skin Head - Anterior (Face)  Botox today - 53.75 units  Frown Complex 30 units Forehead 7.5 units Left botox comma 1.25 units Crows Feet 7.5 units each side (1+ units inf to each lat canthus)   Filling material injection - Head - Anterior (Face) Location: See attached image  Informed consent: Discussed risks (infection, pain, bleeding, bruising, swelling, allergic reaction, paralysis of nearby muscles, eyelid droop, double vision, neck weakness, difficulty breathing, headache, undesirable cosmetic result, and need for additional treatment) and benefits of the procedure, as well as the alternatives.  Informed consent was obtained.  Preparation: The area was cleansed with alcohol.  Procedure Details:  Botox was injected into the dermis with a 30-gauge needle. Pressure applied to any bleeding. Ice packs offered for swelling.  Lot Number:  [id number] Expiration:  01/2023  Total Units Injected:  53.75  Plan: Patient was instructed to remain upright for 4 hours. Patient was instructed to avoid massaging the face and avoid vigorous exercise for the rest of the day. Tylenol may be used for headache.  Allow 2 weeks before returning to clinic for additional dosing as  needed. Patient will call for any problems.   Return for Botox in 3-4 months.  I, Ashok Cordia, CMA, am acting as scribe for Sarina Ser, MD . Documentation: I have reviewed the above documentation for accuracy and completeness, and I agree with the above.  Sarina Ser, MD

## 2021-04-05 ENCOUNTER — Encounter: Payer: Self-pay | Admitting: Dermatology

## 2021-05-20 ENCOUNTER — Encounter: Payer: Self-pay | Admitting: Internal Medicine

## 2021-06-02 ENCOUNTER — Other Ambulatory Visit (HOSPITAL_COMMUNITY): Payer: Self-pay

## 2021-06-02 MED ORDER — INFLUENZA VAC SPLIT QUAD 0.5 ML IM SUSY
PREFILLED_SYRINGE | INTRAMUSCULAR | 0 refills | Status: DC
  Filled 2021-06-02: qty 0.5, 1d supply, fill #0

## 2021-06-03 ENCOUNTER — Other Ambulatory Visit (HOSPITAL_COMMUNITY): Payer: Self-pay

## 2021-06-03 ENCOUNTER — Telehealth: Payer: Self-pay | Admitting: Internal Medicine

## 2021-06-03 NOTE — Telephone Encounter (Signed)
You are correct!  Yes, please.

## 2021-06-03 NOTE — Telephone Encounter (Signed)
Pt is scheduled a nurse visit for 11/15 at 2pm. She called today asking to move appointment up. She said that Sunday she started bleeding and thinks it's a hemorrhoid. Should I cancel this NV and put her on the schedule to be seen by provider? Please advise. [phone]

## 2021-06-10 ENCOUNTER — Other Ambulatory Visit: Payer: Self-pay

## 2021-06-10 ENCOUNTER — Ambulatory Visit (INDEPENDENT_AMBULATORY_CARE_PROVIDER_SITE_OTHER): Payer: No Typology Code available for payment source | Admitting: Internal Medicine

## 2021-06-10 ENCOUNTER — Encounter: Payer: Self-pay | Admitting: Internal Medicine

## 2021-06-10 VITALS — BP 141/94 | HR 72 | Temp 97.7°F | Ht 67.0 in | Wt 166.4 lb

## 2021-06-10 DIAGNOSIS — K6289 Other specified diseases of anus and rectum: Secondary | ICD-10-CM | POA: Diagnosis not present

## 2021-06-10 DIAGNOSIS — K625 Hemorrhage of anus and rectum: Secondary | ICD-10-CM

## 2021-06-10 MED ORDER — PEG 3350-KCL-NA BICARB-NACL 420 G PO SOLR: 4000.0000 mL | ORAL | 0 refills | Status: DC

## 2021-06-10 NOTE — Patient Instructions (Signed)
We will schedule you for colonoscopy to further evaluate your rectal bleeding as well as for colon cancer screening purposes.  If hemorrhoids are identified as the primary source of your bleeding, we can discuss treatment options.  It was very nice meeting you today.  Dr. Abbey Chatters  At [facility] we value your feedback. You may receive a survey about your visit today. Please share your experience as we strive to create trusting relationships with our patients to provide genuine, compassionate, quality care.  We appreciate your understanding and patience as we review any laboratory studies, imaging, and other diagnostic tests that are ordered as we care for you. Our office policy is 5 business days for review of these results, and any emergent or urgent results are addressed in a timely manner for your best interest. If you do not hear from our office in 1 week, please contact us.   We also encourage the use of MyChart, which contains your medical information for your review as well. If you are not enrolled in this feature, an access code is on this after visit summary for your convenience. Thank you for allowing [place] to be involved in your care.  It was great to see you today!  I hope you have a great rest of your Fall!    Elon Alas. Abbey Chatters, D.O. Gastroenterology and Hepatology [facility]

## 2021-06-10 NOTE — H&P (View-Only) (Signed)
Primary Care Physician:  Sharilyn Sites, MD Primary Gastroenterologist:  Dr. Abbey Chatters  Chief Complaint  Patient presents with   Rectal Bleeding    Never had tcs   Hemorrhoids    Had bleeding for 10 days    HPI:   Janet Nielsen is a 50 y.o. female who presents to the clinic today by referral from her PCP Dr. Hilma Favors for evaluation.  Patient states approximately 2 to 3 weeks ago she had sudden onset of rectal discomfort followed by profuse rectal bleeding.  States when it started there was a large amount of bright red blood.  No melena.  No clots.  This continued for about 10 days and finally ceased.  Today she states she is having very minimal bleeding.  Rectal pain is also improved.  No previous colonoscopy.  No abdominal pain.  No unintentional weight loss.  No family history of colorectal malignancy.  Denies any upper GI symptoms including heartburn, reflux, odynophagia, dysphagia, epigastric/chest pain.  Past Medical History:  Diagnosis Date   Allergic rhinitis    Joint pain     Past Surgical History:  Procedure Laterality Date   BREAST REDUCTION SURGERY     TUBAL LIGATION      Current Outpatient Medications  Medication Sig Dispense Refill   celecoxib (CELEBREX) 200 MG capsule Take 1-2 capsules by mouth as needed.     ibuprofen (ADVIL,MOTRIN) 200 MG tablet Take 200-800 mg by mouth once as needed. For headache pain      valACYclovir (VALTREX) 500 MG tablet Take 1,000 mg by mouth as needed.     influenza vac split quadrivalent PF (FLUARIX) 0.5 ML injection Inject into the muscle. (Patient not taking: Reported on 06/10/2021) 0.5 mL 0   lidocaine (XYLOCAINE) 2 % solution Use as directed 10 mLs in the mouth or throat as needed for mouth pain. (Patient not taking: Reported on 06/10/2021) 100 mL 1   naproxen sodium (ANAPROX) 220 MG tablet Take 440 mg by mouth once as needed. For pain  (Patient not taking: Reported on 06/10/2021)     No current facility-administered medications  for this visit.    Allergies as of 06/10/2021   (No Known Allergies)    Family History  Problem Relation Age of Onset   High Cholesterol Mother    High Cholesterol Sister     Social History   Socioeconomic History   Marital status: Married    Spouse name: Not on file   Number of children: Not on file   Years of education: Not on file   Highest education level: Not on file  Occupational History   Not on file  Tobacco Use   Smoking status: Never   Smokeless tobacco: Never  Substance and Sexual Activity   Alcohol use: Yes    Comment: every weekend   Drug use: No   Sexual activity: Not on file  Other Topics Concern   Not on file  Social History Narrative   Not on file   Social Determinants of Health   Financial Resource Strain: Not on file  Food Insecurity: Not on file  Transportation Needs: Not on file  Physical Activity: Not on file  Stress: Not on file  Social Connections: Not on file  Intimate Partner Violence: Not on file    Subjective: Review of Systems  Constitutional:  Negative for chills and fever.  HENT:  Negative for congestion and hearing loss.   Eyes:  Negative for blurred vision and double vision.  Respiratory:  Negative for cough and shortness of breath.   Cardiovascular:  Negative for chest pain and palpitations.  Gastrointestinal:  Positive for blood in stool. Negative for abdominal pain, constipation, diarrhea, heartburn, melena and vomiting.  Genitourinary:  Negative for dysuria and urgency.  Musculoskeletal:  Negative for joint pain and myalgias.  Skin:  Negative for itching and rash.  Neurological:  Negative for dizziness and headaches.  Psychiatric/Behavioral:  Negative for depression. The patient is not nervous/anxious.       Objective: BP (!) 141/94   Pulse 72   Temp 97.7 F (36.5 C) (Temporal)   Ht 5\' 7"  (1.702 m)   Wt 166 lb 6.4 oz (75.5 kg)   LMP 03/09/2011   BMI 26.06 kg/m  Physical Exam Constitutional:       Appearance: Normal appearance.  HENT:     Head: Normocephalic and atraumatic.  Eyes:     Extraocular Movements: Extraocular movements intact.     Conjunctiva/sclera: Conjunctivae normal.  Cardiovascular:     Rate and Rhythm: Normal rate and regular rhythm.  Pulmonary:     Effort: Pulmonary effort is normal.     Breath sounds: Normal breath sounds.  Abdominal:     General: Bowel sounds are normal.     Palpations: Abdomen is soft.  Musculoskeletal:        General: No swelling. Normal range of motion.     Cervical back: Normal range of motion and neck supple.  Skin:    General: Skin is warm and dry.     Coloration: Skin is not jaundiced.  Neurological:     General: No focal deficit present.     Mental Status: She is alert and oriented to person, place, and time.  Psychiatric:        Mood and Affect: Mood normal.        Behavior: Behavior normal.  Rectal exam deferred by patient until time of colonoscopy.   Assessment: *Rectal bleeding *Rectal pain  Plan: Will schedule for diagnostic colonoscopy.The risks including infection, bleed, or perforation as well as benefits, limitations, alternatives and imponderables have been reviewed with the patient. Questions have been answered. All parties agreeable.  If hemorrhoids identified as source of bleeding, can consider hemorrhoid banding in the outpatient clinic.  Thank you Dr. Hilma Favors for the kind referral  06/10/2021 4:21 PM   Disclaimer: This note was dictated with voice recognition software. Similar sounding words can inadvertently be transcribed and may not be corrected upon review.

## 2021-06-10 NOTE — Progress Notes (Signed)
Primary Care Physician:  Sharilyn Sites, MD Primary Gastroenterologist:  Dr. Abbey Chatters  Chief Complaint  Patient presents with   Rectal Bleeding    Never had tcs   Hemorrhoids    Had bleeding for 10 days    HPI:   Janet Nielsen is a 50 y.o. female who presents to the clinic today by referral from her PCP Dr. Hilma Favors for evaluation.  Patient states approximately 2 to 3 weeks ago she had sudden onset of rectal discomfort followed by profuse rectal bleeding.  States when it started there was a large amount of bright red blood.  No melena.  No clots.  This continued for about 10 days and finally ceased.  Today she states she is having very minimal bleeding.  Rectal pain is also improved.  No previous colonoscopy.  No abdominal pain.  No unintentional weight loss.  No family history of colorectal malignancy.  Denies any upper GI symptoms including heartburn, reflux, odynophagia, dysphagia, epigastric/chest pain.  Past Medical History:  Diagnosis Date   Allergic rhinitis    Joint pain     Past Surgical History:  Procedure Laterality Date   BREAST REDUCTION SURGERY     TUBAL LIGATION      Current Outpatient Medications  Medication Sig Dispense Refill   celecoxib (CELEBREX) 200 MG capsule Take 1-2 capsules by mouth as needed.     ibuprofen (ADVIL,MOTRIN) 200 MG tablet Take 200-800 mg by mouth once as needed. For headache pain      valACYclovir (VALTREX) 500 MG tablet Take 1,000 mg by mouth as needed.     influenza vac split quadrivalent PF (FLUARIX) 0.5 ML injection Inject into the muscle. (Patient not taking: Reported on 06/10/2021) 0.5 mL 0   lidocaine (XYLOCAINE) 2 % solution Use as directed 10 mLs in the mouth or throat as needed for mouth pain. (Patient not taking: Reported on 06/10/2021) 100 mL 1   naproxen sodium (ANAPROX) 220 MG tablet Take 440 mg by mouth once as needed. For pain  (Patient not taking: Reported on 06/10/2021)     No current facility-administered medications  for this visit.    Allergies as of 06/10/2021   (No Known Allergies)    Family History  Problem Relation Age of Onset   High Cholesterol Mother    High Cholesterol Sister     Social History   Socioeconomic History   Marital status: Married    Spouse name: Not on file   Number of children: Not on file   Years of education: Not on file   Highest education level: Not on file  Occupational History   Not on file  Tobacco Use   Smoking status: Never   Smokeless tobacco: Never  Substance and Sexual Activity   Alcohol use: Yes    Comment: every weekend   Drug use: No   Sexual activity: Not on file  Other Topics Concern   Not on file  Social History Narrative   Not on file   Social Determinants of Health   Financial Resource Strain: Not on file  Food Insecurity: Not on file  Transportation Needs: Not on file  Physical Activity: Not on file  Stress: Not on file  Social Connections: Not on file  Intimate Partner Violence: Not on file    Subjective: Review of Systems  Constitutional:  Negative for chills and fever.  HENT:  Negative for congestion and hearing loss.   Eyes:  Negative for blurred vision and double vision.  Respiratory:  Negative for cough and shortness of breath.   Cardiovascular:  Negative for chest pain and palpitations.  Gastrointestinal:  Positive for blood in stool. Negative for abdominal pain, constipation, diarrhea, heartburn, melena and vomiting.  Genitourinary:  Negative for dysuria and urgency.  Musculoskeletal:  Negative for joint pain and myalgias.  Skin:  Negative for itching and rash.  Neurological:  Negative for dizziness and headaches.  Psychiatric/Behavioral:  Negative for depression. The patient is not nervous/anxious.       Objective: BP (!) 141/94   Pulse 72   Temp 97.7 F (36.5 C) (Temporal)   Ht 5\' 7"  (1.702 m)   Wt 166 lb 6.4 oz (75.5 kg)   LMP 03/09/2011   BMI 26.06 kg/m  Physical Exam Constitutional:       Appearance: Normal appearance.  HENT:     Head: Normocephalic and atraumatic.  Eyes:     Extraocular Movements: Extraocular movements intact.     Conjunctiva/sclera: Conjunctivae normal.  Cardiovascular:     Rate and Rhythm: Normal rate and regular rhythm.  Pulmonary:     Effort: Pulmonary effort is normal.     Breath sounds: Normal breath sounds.  Abdominal:     General: Bowel sounds are normal.     Palpations: Abdomen is soft.  Musculoskeletal:        General: No swelling. Normal range of motion.     Cervical back: Normal range of motion and neck supple.  Skin:    General: Skin is warm and dry.     Coloration: Skin is not jaundiced.  Neurological:     General: No focal deficit present.     Mental Status: She is alert and oriented to person, place, and time.  Psychiatric:        Mood and Affect: Mood normal.        Behavior: Behavior normal.  Rectal exam deferred by patient until time of colonoscopy.   Assessment: *Rectal bleeding *Rectal pain  Plan: Will schedule for diagnostic colonoscopy.The risks including infection, bleed, or perforation as well as benefits, limitations, alternatives and imponderables have been reviewed with the patient. Questions have been answered. All parties agreeable.  If hemorrhoids identified as source of bleeding, can consider hemorrhoid banding in the outpatient clinic.  Thank you Dr. Hilma Favors for the kind referral  06/10/2021 4:21 PM   Disclaimer: This note was dictated with voice recognition software. Similar sounding words can inadvertently be transcribed and may not be corrected upon review.

## 2021-06-11 ENCOUNTER — Telehealth: Payer: Self-pay

## 2021-06-11 ENCOUNTER — Other Ambulatory Visit: Payer: Self-pay

## 2021-06-11 NOTE — Telephone Encounter (Signed)
Called [facility], spoke to [facility], no PA needed for TCS. Ref# [id number].

## 2021-06-13 ENCOUNTER — Ambulatory Visit
Admission: EM | Admit: 2021-06-13 | Discharge: 2021-06-13 | Disposition: A | Discharge status: Home / Self Care | Payer: No Typology Code available for payment source | Attending: Urgent Care | Admitting: Urgent Care

## 2021-06-13 ENCOUNTER — Other Ambulatory Visit: Payer: Self-pay

## 2021-06-13 ENCOUNTER — Encounter: Payer: Self-pay | Admitting: Emergency Medicine

## 2021-06-13 DIAGNOSIS — J018 Other acute sinusitis: Secondary | ICD-10-CM

## 2021-06-13 DIAGNOSIS — R0981 Nasal congestion: Secondary | ICD-10-CM

## 2021-06-13 DIAGNOSIS — R051 Acute cough: Secondary | ICD-10-CM

## 2021-06-13 LAB — POCT INFLUENZA A/B
Influenza A, POC: NEGATIVE
Influenza B, POC: NEGATIVE

## 2021-06-13 MED ORDER — CETIRIZINE HCL 10 MG PO TABS: 10.0000 mg | ORAL_TABLET | Freq: Every day | ORAL | 0 refills | Status: DC

## 2021-06-13 MED ORDER — PSEUDOEPHEDRINE HCL 30 MG PO TABS: 30.0000 mg | ORAL_TABLET | Freq: Three times a day (TID) | ORAL | 0 refills | Status: DC | PRN

## 2021-06-13 MED ORDER — AMOXICILLIN 875 MG PO TABS: 875.0000 mg | ORAL_TABLET | Freq: Two times a day (BID) | ORAL | 0 refills | Status: DC

## 2021-06-13 NOTE — ED Provider Notes (Signed)
[place]   MRN: [id number] DOB: 30-Aug-1970  Subjective:   Janet Nielsen is a 50 y.o. female presenting for 1 week history of persistent and worsening sinus congestion, sinus pressure, postnasal drainage, throat pain, coughing.  Patient has been using pseudoephedrine, over-the-counter medications with minimal relief.  No chest pain, shortness of breath, body aches.  Her husband wanted to have her checked for flu.  Patient is not a smoker, no history of asthma or respiratory disorders.  No current facility-administered medications for this encounter.  Current Outpatient Medications:    celecoxib (CELEBREX) 200 MG capsule, Take 1-2 capsules by mouth as needed., Disp: , Rfl:    ibuprofen (ADVIL,MOTRIN) 200 MG tablet, Take 200-800 mg by mouth once as needed. For headache pain , Disp: , Rfl:    influenza vac split quadrivalent PF (FLUARIX) 0.5 ML injection, Inject into the muscle. (Patient not taking: Reported on 06/10/2021), Disp: 0.5 mL, Rfl: 0   lidocaine (XYLOCAINE) 2 % solution, Use as directed 10 mLs in the mouth or throat as needed for mouth pain. (Patient not taking: Reported on 06/10/2021), Disp: 100 mL, Rfl: 1   naproxen sodium (ANAPROX) 220 MG tablet, Take 440 mg by mouth once as needed. For pain  (Patient not taking: Reported on 06/10/2021), Disp: , Rfl:    polyethylene glycol-electrolytes (TRILYTE) 420 g solution, Take 4,000 mLs by mouth as directed., Disp: 4000 mL, Rfl: 0   valACYclovir (VALTREX) 500 MG tablet, Take 1,000 mg by mouth as needed., Disp: , Rfl:    No Known Allergies  Past Medical History:  Diagnosis Date   Allergic rhinitis    Joint pain      Past Surgical History:  Procedure Laterality Date   BREAST REDUCTION SURGERY     TUBAL LIGATION      Family History  Problem Relation Age of Onset   High Cholesterol Mother    High Cholesterol Sister     Social History   Tobacco Use   Smoking status: Never   Smokeless tobacco: Never   Substance Use Topics   Alcohol use: Yes    Comment: every weekend   Drug use: No    ROS   Objective:   Vitals: BP (!) 147/89 (BP Location: Right Arm)   Pulse 82   Temp 98.4 F (36.9 C) (Oral)   Resp 18   LMP 03/09/2011   SpO2 98%   Physical Exam Constitutional:      General: She is not in acute distress.    Appearance: Normal appearance. She is well-developed. She is not ill-appearing, toxic-appearing or diaphoretic.  HENT:     Head: Normocephalic and atraumatic.     Right Ear: Tympanic membrane and ear canal normal. No drainage or tenderness. No middle ear effusion. Tympanic membrane is not erythematous.     Left Ear: Tympanic membrane and ear canal normal. No drainage or tenderness.  No middle ear effusion. Tympanic membrane is not erythematous.     Nose: Nose normal. No congestion or rhinorrhea.     Mouth/Throat:     Mouth: Mucous membranes are moist. No oral lesions.     Pharynx: Oropharynx is clear. No pharyngeal swelling, oropharyngeal exudate, posterior oropharyngeal erythema or uvula swelling.     Tonsils: No tonsillar exudate or tonsillar abscesses.  Eyes:     Extraocular Movements: Extraocular movements intact.     Right eye: Normal extraocular motion.     Left eye: Normal extraocular motion.     Conjunctiva/sclera: Conjunctivae normal.  Pupils: Pupils are equal, round, and reactive to light.  Cardiovascular:     Rate and Rhythm: Normal rate and regular rhythm.     Pulses: Normal pulses.     Heart sounds: Normal heart sounds. No murmur heard.   No friction rub. No gallop.  Pulmonary:     Effort: Pulmonary effort is normal. No respiratory distress.     Breath sounds: Normal breath sounds. No stridor. No wheezing, rhonchi or rales.  Musculoskeletal:     Cervical back: Normal range of motion and neck supple.  Lymphadenopathy:     Cervical: No cervical adenopathy.  Skin:    General: Skin is warm and dry.     Findings: No rash.  Neurological:      General: No focal deficit present.     Mental Status: She is alert and oriented to person, place, and time.  Psychiatric:        Mood and Affect: Mood normal.        Behavior: Behavior normal.        Thought Content: Thought content normal.    Results for orders placed or performed during the hospital encounter of 06/13/21 (from the past 24 hour(s))  POCT Influenza A/B     Status: None   Collection Time: 06/13/21 10:05 AM  Result Value Ref Range   Influenza A, POC Negative Negative   Influenza B, POC Negative Negative    Assessment and Plan :   PDMP not reviewed this encounter.  1. Acute non-recurrent sinusitis of other sinus   2. Sinus congestion   3. Acute cough     Will start empiric treatment for sinusitis with amoxicillin.  Recommended supportive care otherwise including the use of oral antihistamine, decongestant. Deferred imaging given clear cardiopulmonary exam, hemodynamically stable vital signs. Counseled patient on potential for adverse effects with medications prescribed/recommended today, ER and return-to-clinic precautions discussed, patient verbalized understanding.    Jaynee Eagles, PA-C 06/13/21 1025

## 2021-06-13 NOTE — ED Triage Notes (Signed)
Nasal congestion x 1 week. Now throat is sore and fever since this morning.  Chills and body aches.

## 2021-06-30 ENCOUNTER — Ambulatory Visit: Payer: No Typology Code available for payment source

## 2021-07-07 ENCOUNTER — Encounter (HOSPITAL_COMMUNITY)
Admission: RE | Disposition: A | Discharge status: Home / Self Care | Payer: Self-pay | Source: Home / Self Care | Attending: Internal Medicine

## 2021-07-07 ENCOUNTER — Ambulatory Visit (HOSPITAL_COMMUNITY): Payer: No Typology Code available for payment source | Admitting: Anesthesiology

## 2021-07-07 ENCOUNTER — Encounter (HOSPITAL_COMMUNITY): Payer: Self-pay

## 2021-07-07 ENCOUNTER — Other Ambulatory Visit: Payer: Self-pay

## 2021-07-07 ENCOUNTER — Ambulatory Visit (HOSPITAL_COMMUNITY)
Admission: RE | Admit: 2021-07-07 | Discharge: 2021-07-07 | Disposition: A | Discharge status: Home / Self Care | Payer: No Typology Code available for payment source | Attending: Internal Medicine | Admitting: Internal Medicine

## 2021-07-07 DIAGNOSIS — D125 Benign neoplasm of sigmoid colon: Secondary | ICD-10-CM | POA: Diagnosis not present

## 2021-07-07 DIAGNOSIS — K625 Hemorrhage of anus and rectum: Secondary | ICD-10-CM | POA: Diagnosis not present

## 2021-07-07 DIAGNOSIS — K635 Polyp of colon: Secondary | ICD-10-CM | POA: Insufficient documentation

## 2021-07-07 DIAGNOSIS — K648 Other hemorrhoids: Secondary | ICD-10-CM | POA: Diagnosis not present

## 2021-07-07 HISTORY — PX: POLYPECTOMY: SHX5525

## 2021-07-07 HISTORY — PX: COLONOSCOPY WITH PROPOFOL: SHX5780

## 2021-07-07 SURGERY — COLONOSCOPY WITH PROPOFOL
Anesthesia: General

## 2021-07-07 MED ORDER — LIDOCAINE HCL (CARDIAC) PF 100 MG/5ML IV SOSY
PREFILLED_SYRINGE | INTRAVENOUS | Status: DC | PRN
  Administered 2021-07-07: 50 mg via INTRAVENOUS

## 2021-07-07 MED ORDER — LACTATED RINGERS IV SOLN: INTRAVENOUS | Status: DC

## 2021-07-07 MED ORDER — PROPOFOL 10 MG/ML IV BOLUS
INTRAVENOUS | Status: DC | PRN
  Administered 2021-07-07: 120 mg via INTRAVENOUS
  Administered 2021-07-07 (×2): 40 mg via INTRAVENOUS

## 2021-07-07 NOTE — Transfer of Care (Signed)
Immediate Anesthesia Transfer of Care Note  Patient: Janet Nielsen  Procedure(s) Performed: COLONOSCOPY WITH PROPOFOL POLYPECTOMY  Patient Location: Endoscopy Unit  Anesthesia Type:General  Level of Consciousness: awake  Airway & Oxygen Therapy: Patient Spontanous Breathing  Post-op Assessment: Report given to RN and Post -op Vital signs reviewed and stable  Post vital signs: Reviewed and stable  Last Vitals:  Vitals Value Taken Time  BP    Temp    Pulse    Resp    SpO2      Last Pain:  Vitals:   07/07/21 0859  TempSrc: Oral  PainSc: 0-No pain      Patients Stated Pain Goal: 2 (19/47/12 5271)  Complications: No notable events documented.

## 2021-07-07 NOTE — Anesthesia Procedure Notes (Signed)
Date/Time: 07/07/2021 10:14 AM Performed by: Orlie Dakin, CRNA Pre-anesthesia Checklist: Patient identified, Emergency Drugs available, Suction available and Patient being monitored Patient Re-evaluated:Patient Re-evaluated prior to induction Oxygen Delivery Method: Nasal cannula Induction Type: IV induction Placement Confirmation: positive ETCO2

## 2021-07-07 NOTE — Anesthesia Preprocedure Evaluation (Addendum)
Anesthesia Evaluation  Patient identified by MRN, date of birth, ID band Patient awake    Reviewed: Allergy & Precautions, NPO status , Patient's Chart, lab work & pertinent test results  History of Anesthesia Complications Negative for: history of anesthetic complications  Airway Mallampati: II  TM Distance: >3 FB Neck ROM: Full    Dental  (+) Dental Advisory Given,    Pulmonary neg pulmonary ROS,    Pulmonary exam normal breath sounds clear to auscultation       Cardiovascular Exercise Tolerance: Good Normal cardiovascular exam Rhythm:Regular Rate:Normal     Neuro/Psych negative neurological ROS  negative psych ROS   GI/Hepatic negative GI ROS, Neg liver ROS,   Endo/Other  negative endocrine ROS  Renal/GU negative Renal ROS     Musculoskeletal negative musculoskeletal ROS (+)   Abdominal   Peds  Hematology negative hematology ROS (+)   Anesthesia Other Findings   Reproductive/Obstetrics negative OB ROS                            Anesthesia Physical Anesthesia Plan  ASA: 1  Anesthesia Plan: General   Post-op Pain Management: Minimal or no pain anticipated   Induction: Intravenous  PONV Risk Score and Plan: TIVA  Airway Management Planned: Nasal Cannula and Natural Airway  Additional Equipment:   Intra-op Plan:   Post-operative Plan:   Informed Consent: I have reviewed the patients History and Physical, chart, labs and discussed the procedure including the risks, benefits and alternatives for the proposed anesthesia with the patient or authorized representative who has indicated his/her understanding and acceptance.     Dental advisory given  Plan Discussed with: CRNA and Surgeon  Anesthesia Plan Comments:        Anesthesia Quick Evaluation

## 2021-07-07 NOTE — Anesthesia Postprocedure Evaluation (Signed)
Anesthesia Post Note  Patient: Nicholas S Mathenia  Procedure(s) Performed: COLONOSCOPY WITH PROPOFOL POLYPECTOMY  Patient location during evaluation: Phase II Anesthesia Type: General Level of consciousness: awake and alert and oriented Pain management: pain level controlled Vital Signs Assessment: post-procedure vital signs reviewed and stable Respiratory status: spontaneous breathing and respiratory function stable Cardiovascular status: blood pressure returned to baseline and stable Postop Assessment: no apparent nausea or vomiting Anesthetic complications: no   No notable events documented.   Last Vitals:  Vitals:   07/07/21 0859 07/07/21 1027  BP: (!) 140/97 100/63  Pulse: 67   Resp: 16 17  Temp: 36.8 C 36.5 C  SpO2: 99% 100%    Last Pain:  Vitals:   07/07/21 1027  TempSrc: Oral  PainSc: 0-No pain                 Nalin Mazzocco C Izekiel Flegel

## 2021-07-07 NOTE — Discharge Instructions (Addendum)
  Colonoscopy Discharge Instructions  Read the instructions outlined below and refer to this sheet in the next few weeks. These discharge instructions provide you with general information on caring for yourself after you leave the hospital. Your doctor may also give you specific instructions. While your treatment has been planned according to the most current medical practices available, unavoidable complications occasionally occur.   ACTIVITY You may resume your regular activity, but move at a slower pace for the next 24 hours.  Take frequent rest periods for the next 24 hours.  Walking will help get rid of the air and reduce the bloated feeling in your belly (abdomen).  No driving for 24 hours (because of the medicine (anesthesia) used during the test).   Do not sign any important legal documents or operate any machinery for 24 hours (because of the anesthesia used during the test).  NUTRITION Drink plenty of fluids.  You may resume your normal diet as instructed by your doctor.  Begin with a light meal and progress to your normal diet. Heavy or fried foods are harder to digest and may make you feel sick to your stomach (nauseated).  Avoid alcoholic beverages for 24 hours or as instructed.  MEDICATIONS You may resume your normal medications unless your doctor tells you otherwise.  WHAT YOU CAN EXPECT TODAY Some feelings of bloating in the abdomen.  Passage of more gas than usual.  Spotting of blood in your stool or on the toilet paper.  IF YOU HAD POLYPS REMOVED DURING THE COLONOSCOPY: No aspirin products for 7 days or as instructed.  No alcohol for 7 days or as instructed.  Eat a soft diet for the next 24 hours.  FINDING OUT THE RESULTS OF YOUR TEST Not all test results are available during your visit. If your test results are not back during the visit, make an appointment with your caregiver to find out the results. Do not assume everything is normal if you have not heard from your  caregiver or the medical facility. It is important for you to follow up on all of your test results.  SEEK IMMEDIATE MEDICAL ATTENTION IF: You have more than a spotting of blood in your stool.  Your belly is swollen (abdominal distention).  You are nauseated or vomiting.  You have a temperature over 101.  You have abdominal pain or discomfort that is severe or gets worse throughout the day.   Your colonoscopy revealed 1 polyp(s) which I removed successfully. Await pathology results, my office will contact you. I recommend repeating colonoscopy in 5-10 years for surveillance purposes depending on pathology results. You do have hemorrhoids which is likely the cause of your bleeding. Im happy to hear this is improved. If your bleeding returns, then let us know and we can set you up for hemorrhoid banding. Otherwise follow up with GI as needed.    I hope you have a great rest of your week!  Elon Alas. Abbey Chatters, D.O. Gastroenterology and Hepatology [facility]

## 2021-07-07 NOTE — Interval H&P Note (Signed)
History and Physical Interval Note:  07/07/2021 9:34 AM  Kara S Berni  has presented today for surgery, with the diagnosis of rectal bleeding.  The various methods of treatment have been discussed with the patient and family. After consideration of risks, benefits and other options for treatment, the patient has consented to  Procedure(s) with comments: COLONOSCOPY WITH PROPOFOL (N/A) - 10:15am as a surgical intervention.  The patient's history has been reviewed, patient examined, no change in status, stable for surgery.  I have reviewed the patient's chart and labs.  Questions were answered to the patient's satisfaction.     Eloise Harman

## 2021-07-07 NOTE — Op Note (Signed)
[facility] Patient Name: Janet Nielsen Procedure Date: 07/07/2021 10:08 AM MRN: [id number] Date of Birth: [birth year] Attending MD: Elon Alas. Abbey Chatters DO CSN: [id number] Age: 50 Admit Type: Outpatient Procedure:                Colonoscopy Indications:              Rectal bleeding Providers:                Elon Alas. Abbey Chatters, DO, Janeece Riggers, RN, Nelma Rothman,                            Technician Referring MD:              Medicines:                See the Anesthesia note for documentation of the                            administered medications Complications:            No immediate complications. Estimated Blood Loss:     Estimated blood loss was minimal. Procedure:                Pre-Anesthesia Assessment:                           - The anesthesia plan was to use monitored                            anesthesia care (MAC).                           After obtaining informed consent, the colonoscope                            was passed under direct vision. Throughout the                            procedure, the patient's blood pressure, pulse, and                            oxygen saturations were monitored continuously. The                            [id number] ([id number]) scope was introduced through                            the anus and advanced to the the cecum, identified                            by appendiceal orifice and ileocecal valve. The                            colonoscopy was performed without difficulty. The                            patient tolerated the procedure well. The quality  of the bowel preparation was evaluated using the                            BBPS [facility] Bowel Preparation Scale) with scores                            of: Right Colon = 3, Transverse Colon = 3 and Left                            Colon = 3 (entire mucosa seen well with no residual                            staining, small fragments of stool or opaque                             liquid). The total BBPS score equals 9. Scope In: 10:11:53 AM Scope Out: 10:24:54 AM Scope Withdrawal Time: 0 hours 8 minutes 23 seconds  Total Procedure Duration: 0 hours 13 minutes 1 second  Findings:      Hemorrhoids were found on perianal exam.      Non-bleeding internal hemorrhoids were found during endoscopy.      A 5 mm polyp was found in the sigmoid colon. The polyp was flat. The       polyp was removed with a cold snare. Resection and retrieval were       complete.      The exam was otherwise without abnormality. Impression:               - Hemorrhoids found on perianal exam.                           - Non-bleeding internal hemorrhoids.                           - One 5 mm polyp in the sigmoid colon, removed with                            a cold snare. Resected and retrieved.                           - The examination was otherwise normal. Moderate Sedation:      Per Anesthesia Care Recommendation:           - Patient has a contact number available for                            emergencies. The signs and symptoms of potential                            delayed complications were discussed with the                            patient. Return to normal activities tomorrow.  Written discharge instructions were provided to the                            patient.                           - Resume previous diet.                           - Continue present medications.                           - Await pathology results.                           - Repeat colonoscopy in 5-10 years for surveillance.                           - Return to GI clinic PRN for hemorrhoid banding if                            rectal bleeding returns. . Procedure Code(s):        --- Professional ---                           [phone], Colonoscopy, flexible; with removal of                            tumor(s), polyp(s), or other lesion(s) by snare                             technique Diagnosis Code(s):        --- Professional ---                           K64.8, Other hemorrhoids                           K63.5, Polyp of colon                           K62.5, Hemorrhage of anus and rectum CPT copyright 2019 American Medical Association. All rights reserved. The codes documented in this report are preliminary and upon coder review may  be revised to meet current compliance requirements. Elon Alas. Abbey Chatters, DO [place] Abbey Chatters, DO 07/07/2021 10:30:06 AM This report has been signed electronically. Number of Addenda: 0

## 2021-07-08 ENCOUNTER — Encounter (HOSPITAL_COMMUNITY): Payer: Self-pay | Admitting: Internal Medicine

## 2021-07-08 LAB — SURGICAL PATHOLOGY

## 2021-07-21 ENCOUNTER — Ambulatory Visit: Payer: Self-pay | Admitting: Dermatology

## 2021-12-21 ENCOUNTER — Telehealth: Payer: Self-pay | Admitting: Podiatry

## 2021-12-21 ENCOUNTER — Ambulatory Visit (INDEPENDENT_AMBULATORY_CARE_PROVIDER_SITE_OTHER): Payer: PRIVATE HEALTH INSURANCE | Admitting: Podiatry

## 2021-12-21 ENCOUNTER — Ambulatory Visit (INDEPENDENT_AMBULATORY_CARE_PROVIDER_SITE_OTHER): Payer: PRIVATE HEALTH INSURANCE

## 2021-12-21 DIAGNOSIS — M7752 Other enthesopathy of left foot: Secondary | ICD-10-CM

## 2021-12-21 DIAGNOSIS — M7731 Calcaneal spur, right foot: Secondary | ICD-10-CM | POA: Diagnosis not present

## 2021-12-21 DIAGNOSIS — M722 Plantar fascial fibromatosis: Secondary | ICD-10-CM | POA: Diagnosis not present

## 2021-12-21 DIAGNOSIS — M7751 Other enthesopathy of right foot: Secondary | ICD-10-CM | POA: Diagnosis not present

## 2021-12-21 DIAGNOSIS — M7750 Other enthesopathy of unspecified foot: Secondary | ICD-10-CM

## 2021-12-21 MED ORDER — MELOXICAM 15 MG PO TABS: 15.0000 mg | ORAL_TABLET | Freq: Every day | ORAL | 3 refills | Status: DC

## 2021-12-21 NOTE — Telephone Encounter (Signed)
Patient called she was seen in office today  - was to have a RX called into [facility] in [place] , nothing has been called in ? Please advise

## 2021-12-22 ENCOUNTER — Ambulatory Visit (INDEPENDENT_AMBULATORY_CARE_PROVIDER_SITE_OTHER): Payer: No Typology Code available for payment source | Admitting: Dermatology

## 2021-12-22 DIAGNOSIS — L905 Scar conditions and fibrosis of skin: Secondary | ICD-10-CM

## 2021-12-22 DIAGNOSIS — L988 Other specified disorders of the skin and subcutaneous tissue: Secondary | ICD-10-CM

## 2021-12-22 NOTE — Patient Instructions (Signed)

## 2021-12-22 NOTE — Telephone Encounter (Signed)
Patient would like exercises she is suppose to do at home emailed to her.   [email or web address]

## 2021-12-22 NOTE — Progress Notes (Signed)
   Follow-Up Visit   Subjective  Janet Nielsen is a 51 y.o. female who presents for the following: Facial Elastosis (Face, pt presents for botox today).  The following portions of the chart were reviewed this encounter and updated as appropriate:   Tobacco  Allergies  Meds  Problems  Med Hx  Surg Hx  Fam Hx     Review of Systems:  No other skin or systemic complaints except as noted in HPI or Assessment and Plan.  Objective  Well appearing patient in no apparent distress; mood and affect are within normal limits.  A focused examination was performed including face. Relevant physical exam findings are noted in the Assessment and Plan.  face Rhytides and volume loss.      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  Informed consent was obtained.  Preparation: The area was cleansed with alcohol.  Procedure Details:  Botox was injected into the dermis with a 30-gauge needle. Pressure applied to any bleeding. Ice packs offered for swelling.  Lot Number:  [id number] Expiration:  11/2023  Total Units Injected:  31.25  Plan: Patient was instructed to remain upright for 4 hours. Patient was instructed to avoid massaging the face and avoid vigorous exercise for the rest of the day. Tylenol may be used for headache.  Allow 2 weeks before returning to clinic for additional dosing as needed. Patient will call for any problems.  Scar glabella Benign, secondary to chicken  poxs  Return for 3-48mBotox.  I, SOthelia Pulling RMA, am acting as scribe for DSarina Ser MD . Documentation: I have reviewed the above documentation for accuracy and completeness, and I agree with the above.  DSarina Ser MD

## 2021-12-24 ENCOUNTER — Encounter: Payer: Self-pay | Admitting: Podiatry

## 2021-12-24 NOTE — Telephone Encounter (Signed)
Did not see any on AVS summary, can you email to patient on address on file.

## 2021-12-27 ENCOUNTER — Encounter: Payer: Self-pay | Admitting: Podiatry

## 2021-12-27 NOTE — Progress Notes (Signed)
?  Subjective:  ?Patient ID: Janet Nielsen, female    DOB: 1970/12/16,  MRN: [id number] ? ?Chief Complaint  ?Patient presents with  ? Foot Problem  ?  Np left heel pain- onset about 3 months ago. Dull pain that radiates from left heel to arch. Worst when standing after sitting for a long period of time.   ? ? ?51 y.o. female presents with the above complaint. History confirmed with patient.  ? ?Objective:  ?Physical Exam: ?warm, good capillary refill, no trophic changes or ulcerative lesions, normal DP and PT pulses, and normal sensory exam. ?Left Foot: point tenderness over the heel pad, point tenderness of the mid plantar fascia, and gastrocnemius equinus is noted with a positive silverskiold test ?Right Foot: normal exam, no swelling, tenderness, instability; ligaments intact, full range of motion of all ankle/foot joints ? ?No images are attached to the encounter. ? ?Radiographs: ?Multiple views x-ray of both feet: no fracture, dislocation, swelling or degenerative changes noted and plantar calcaneal spur ?Assessment:  ? ?1. Plantar fasciitis of left foot   ?2. Tendonitis of ankle or foot   ? ? ? ?Plan:  ?Patient was evaluated and treated and all questions answered. ? ?Discussed the etiology and treatment options for plantar fasciitis including stretching, formal physical therapy, supportive shoegears such as a running shoe or sneaker, pre fabricated orthoses, injection therapy, and oral medications. We also discussed the role of surgical treatment of this for patients who do not improve after exhausting non-surgical treatment options. ? ? ?-XR reviewed with patient ?-Educated patient on stretching and icing of the affected limb ?-Plantar fascial brace dispensed ?-Injection delivered to the plantar fascia of the left foot. ?-Rx for meloxicam. Educated on use, risks and benefits of the medication ? ?After sterile prep with povidone-iodine solution and alcohol, the left heel was injected with 0.5cc 2% xylocaine  plain, 0.5cc 0.5% marcaine plain, '5mg'$  triamcinolone acetonide, and '2mg'$  dexamethasone was injected along the medial plantar fascia at the insertion on the plantar calcaneus. The patient tolerated the procedure well without complication. ? ?Return in about 1 month (around 01/21/2022) for recheck plantar fasciitis.  ? ?

## 2022-01-05 ENCOUNTER — Encounter: Payer: Self-pay | Admitting: Dermatology

## 2022-01-18 ENCOUNTER — Ambulatory Visit (INDEPENDENT_AMBULATORY_CARE_PROVIDER_SITE_OTHER): Payer: PRIVATE HEALTH INSURANCE | Admitting: Podiatry

## 2022-01-18 DIAGNOSIS — M722 Plantar fascial fibromatosis: Secondary | ICD-10-CM

## 2022-01-18 DIAGNOSIS — M7731 Calcaneal spur, right foot: Secondary | ICD-10-CM | POA: Diagnosis not present

## 2022-01-18 NOTE — Progress Notes (Signed)
  Subjective:  Patient ID: Janet Nielsen, female    DOB: May 03, 1971,  MRN: [id number]  Chief Complaint  Patient presents with   Plantar Fasciitis    1 month follow up left    51 y.o. female presents with the above complaint. History confirmed with patient.   Interval History: Says she has had improvement about 50 to 60% better, the last injection was quite helpful  Objective:  Physical Exam: warm, good capillary refill, no trophic changes or ulcerative lesions, normal DP and PT pulses, and normal sensory exam. Left Foot: Slight improvement in equinus, she has no pain on direct palpation today of the medial plantar calcaneal tubercle or the plantar fascia itself Right Foot: normal exam, no swelling, tenderness, instability; ligaments intact, full range of motion of all ankle/foot joints  No images are attached to the encounter.  Radiographs: Multiple views x-ray of both feet: no fracture, dislocation, swelling or degenerative changes noted and plantar calcaneal spur Assessment:   1. Plantar fasciitis of left foot   2. Heel spur, right       Plan:  Patient was evaluated and treated and all questions answered.  Doing much better has made quite a bit of improvement with 1 injection, Mobic, stretches at home with physical therapy and plantar fascia brace.  Discussed continuing her home therapy plan for another 4 to 6 weeks until this completely resolves.  I advised her if it does not resolve in 6 weeks then she should return to see me for further injection.  Return if symptoms worsen or fail to improve.

## 2022-03-22 ENCOUNTER — Encounter: Payer: Self-pay | Admitting: Podiatry

## 2022-03-22 ENCOUNTER — Ambulatory Visit (INDEPENDENT_AMBULATORY_CARE_PROVIDER_SITE_OTHER): Payer: PRIVATE HEALTH INSURANCE | Admitting: Podiatry

## 2022-03-22 DIAGNOSIS — M722 Plantar fascial fibromatosis: Secondary | ICD-10-CM

## 2022-03-22 NOTE — Progress Notes (Signed)
  Subjective:  Patient ID: Janet Nielsen, female    DOB: 01-03-1971,  MRN: [id number]  Chief Complaint  Patient presents with   Plantar Fasciitis    Left heel pain has gotten worse patient reports 8/10 on the pain scale since last office visit. Patient reports wearing PF brace as needed.     51 y.o. female presents with the above complaint. History confirmed with patient.   Interval History: Since last visit has gotten quite a bit worse for her.  It is as bad as it was when she started treatment now.  Recently on vacation.  She has been wearing the plantar fascia brace when she needs it  Objective:  Physical Exam: warm, good capillary refill, no trophic changes or ulcerative lesions, normal DP and PT pulses, and normal sensory exam. Left Foot: Still has tight calves with equinus, she has significant pain on direct palpation today of the medial plantar calcaneal tubercle or the plantar fascia itself Right Foot: normal exam, no swelling, tenderness, instability; ligaments intact, full range of motion of all ankle/foot joints  No images are attached to the encounter.  Radiographs: Multiple views x-ray of both feet: no fracture, dislocation, swelling or degenerative changes noted and plantar calcaneal spur Assessment:   1. Plantar fasciitis of left foot       Plan:  Patient was evaluated and treated and all questions answered.  Fortunately has not improved much and has regressed.  I recommended repeat corticosteroid injection which was performed as noted below.  We increased her immobilization to a short cam walker boot today.  I also recommend physical therapy and referral was sent to [facility] in [place].  She would like to be better by the end of September when her son is getting married.  I will see her back in 1 month for follow-up.  If not improved by then then I would recommend an MRI.  Return in about 1 month (around 04/22/2022) for recheck plantar fasciitis.

## 2022-04-13 ENCOUNTER — Ambulatory Visit (INDEPENDENT_AMBULATORY_CARE_PROVIDER_SITE_OTHER): Payer: Self-pay | Admitting: Dermatology

## 2022-04-13 ENCOUNTER — Encounter: Payer: Self-pay | Admitting: Dermatology

## 2022-04-13 DIAGNOSIS — L988 Other specified disorders of the skin and subcutaneous tissue: Secondary | ICD-10-CM

## 2022-04-13 NOTE — Patient Instructions (Signed)
Due to recent changes in healthcare laws, you may see results of your pathology and/or laboratory studies on MyChart before the doctors have had a chance to review them. We understand that in some cases there may be results that are confusing or concerning to you. Please understand that not all results are received at the same time and often the doctors may need to interpret multiple results in order to provide you with the best plan of care or course of treatment. Therefore, we ask that you please give us 2 business days to thoroughly review all your results before contacting the office for clarification. Should we see a critical lab result, you will be contacted sooner.   If You Need Anything After Your Visit  If you have any questions or concerns for your doctor, please call our main line at [phone] and press option 4 to reach your doctor's medical assistant. If no one answers, please leave a voicemail as directed and we will return your call as soon as possible. Messages left after 4 pm will be answered the following business day.   You may also send us a message via MyChart. We typically respond to MyChart messages within 1-2 business days.  For prescription refills, please ask your pharmacy to contact our office. Our fax number is [phone].  If you have an urgent issue when the clinic is closed that cannot wait until the next business day, you can page your doctor at the number below.    Please note that while we do our best to be available for urgent issues outside of office hours, we are not available 24/7.   If you have an urgent issue and are unable to reach us, you may choose to seek medical care at your doctor's office, retail clinic, urgent care center, or emergency room.  If you have a medical emergency, please immediately call 911 or go to the emergency department.  Pager Numbers  - Dr. Kowalski: [phone]  - Dr. Moye: [phone]  - Dr. Stewart:  [phone]  In the event of inclement weather, please call our main line at [phone] for an update on the status of any delays or closures.  Dermatology Medication Tips: Please keep the boxes that topical medications come in in order to help keep track of the instructions about where and how to use these. Pharmacies typically print the medication instructions only on the boxes and not directly on the medication tubes.   If your medication is too expensive, please contact our office at [phone] option 4 or send us a message through MyChart.   We are unable to tell what your co-pay for medications will be in advance as this is different depending on your insurance coverage. However, we may be able to find a substitute medication at lower cost or fill out paperwork to get insurance to cover a needed medication.   If a prior authorization is required to get your medication covered by your insurance company, please allow us 1-2 business days to complete this process.  Drug prices often vary depending on where the prescription is filled and some pharmacies may offer cheaper prices.  The website www.goodrx.com contains coupons for medications through different pharmacies. The prices here do not account for what the cost may be with help from insurance (it may be cheaper with your insurance), but the website can give you the price if you did not use any insurance.  - You can print the associated coupon and take it with   your prescription to the pharmacy.  - You may also stop by our office during regular business hours and pick up a GoodRx coupon card.  - If you need your prescription sent electronically to a different pharmacy, notify our office through [facility] MyChart or by phone at [phone] option 4.     Si Usted Necesita Algo Despus de Su Visita  Tambin puede enviarnos un mensaje a travs de MyChart. Por lo general respondemos a los mensajes de MyChart en el transcurso de 1 a 2  das hbiles.  Para renovar recetas, por favor pida a su farmacia que se ponga en contacto con nuestra oficina. Nuestro nmero de fax es el [phone].  Si tiene un asunto urgente cuando la clnica est cerrada y que no puede esperar hasta el siguiente da hbil, puede llamar/localizar a su doctor(a) al nmero que aparece a continuacin.   Por favor, tenga en cuenta que aunque hacemos todo lo posible para estar disponibles para asuntos urgentes fuera del horario de oficina, no estamos disponibles las 24 horas del da, los 7 das de la semana.   Si tiene un problema urgente y no puede comunicarse con nosotros, puede optar por buscar atencin mdica  en el consultorio de su doctor(a), en una clnica privada, en un centro de atencin urgente o en una sala de emergencias.  Si tiene una emergencia mdica, por favor llame inmediatamente al 911 o vaya a la sala de emergencias.  Nmeros de bper  - Dr. Kowalski: [phone]  - Dra. Moye: [phone]  - Dra. Stewart: [phone]  En caso de inclemencias del tiempo, por favor llame a nuestra lnea principal al [phone] para una actualizacin sobre el estado de cualquier retraso o cierre.  Consejos para la medicacin en dermatologa: Por favor, guarde las cajas en las que vienen los medicamentos de uso tpico para ayudarle a seguir las instrucciones sobre dnde y cmo usarlos. Las farmacias generalmente imprimen las instrucciones del medicamento slo en las cajas y no directamente en los tubos del medicamento.   Si su medicamento es muy caro, por favor, pngase en contacto con nuestra oficina llamando al [phone] y presione la opcin 4 o envenos un mensaje a travs de MyChart.   No podemos decirle cul ser su copago por los medicamentos por adelantado ya que esto es diferente dependiendo de la cobertura de su seguro. Sin embargo, es posible que podamos encontrar un medicamento sustituto a menor costo o llenar un formulario para que el  seguro cubra el medicamento que se considera necesario.   Si se requiere una autorizacin previa para que su compaa de seguros cubra su medicamento, por favor permtanos de 1 a 2 das hbiles para completar este proceso.  Los precios de los medicamentos varan con frecuencia dependiendo del lugar de dnde se surte la receta y alguna farmacias pueden ofrecer precios ms baratos.  El sitio web www.goodrx.com tiene cupones para medicamentos de diferentes farmacias. Los precios aqu no tienen en cuenta lo que podra costar con la ayuda del seguro (puede ser ms barato con su seguro), pero el sitio web puede darle el precio si no utiliz ningn seguro.  - Puede imprimir el cupn correspondiente y llevarlo con su receta a la farmacia.  - Tambin puede pasar por nuestra oficina durante el horario de atencin regular y recoger una tarjeta de cupones de GoodRx.  - Si necesita que su receta se enve electrnicamente a una farmacia diferente, informe a nuestra oficina a travs de MyChart de [facility]   o por telfono llamando al [phone] y presione la opcin 4.  

## 2022-04-13 NOTE — Progress Notes (Unsigned)
   Follow-Up Visit   Subjective  Janet Nielsen is a 51 y.o. female who presents for the following: Facial Elastosis (Here for Botox).  The following portions of the chart were reviewed this encounter and updated as appropriate:  Tobacco  Allergies  Meds  Problems  Med Hx  Surg Hx  Fam Hx     Review of Systems: No other skin or systemic complaints except as noted in HPI or Assessment and Plan.  Objective  Well appearing patient in no apparent distress; mood and affect are within normal limits.  A focused examination was performed including head, including the scalp, face, neck, nose, ears, eyelids, and lips. Relevant physical exam findings are noted in the Assessment and Plan.  face Rhytides and volume loss.       Assessment & Plan  Elastosis of skin face  Botox today - 53.75 units   Frown Complex 30 units Forehead 7.5 units Left botox comma 1.25 units Crows Feet 7.5 units each side (1+ units inf to each lat canthus)  Botox Injection - face Location: See attached image  Informed consent: Discussed risks (infection, pain, bleeding, bruising, swelling, allergic reaction, paralysis of nearby muscles, eyelid droop, double vision, neck weakness, difficulty breathing, headache, undesirable cosmetic result, and need for additional treatment) and benefits of the procedure, as well as the alternatives.  Informed consent was obtained.  Preparation: The area was cleansed with alcohol.  Procedure Details:  Botox was injected into the dermis with a 30-gauge needle. Pressure applied to any bleeding. Ice packs offered for swelling.  Lot Number:  [id number] Expiration:  06/2024  Total Units Injected:  53.75  Plan: Patient was instructed to remain upright for 4 hours. Patient was instructed to avoid massaging the face and avoid vigorous exercise for the rest of the day. Tylenol may be used for headache.  Allow 2 weeks before returning to clinic for additional dosing as needed.  Patient will call for any problems.    Return for Botox Follow Up 3-4 months.  I, Emelia Salisbury, CMA, am acting as scribe for Sarina Ser, MD. Documentation: I have reviewed the above documentation for accuracy and completeness, and I agree with the above.  Sarina Ser, MD

## 2022-04-14 ENCOUNTER — Encounter: Payer: Self-pay | Admitting: Dermatology

## 2022-04-21 ENCOUNTER — Ambulatory Visit (INDEPENDENT_AMBULATORY_CARE_PROVIDER_SITE_OTHER): Payer: PRIVATE HEALTH INSURANCE | Admitting: Podiatry

## 2022-04-21 ENCOUNTER — Encounter: Payer: Self-pay | Admitting: Podiatry

## 2022-04-21 DIAGNOSIS — M722 Plantar fascial fibromatosis: Secondary | ICD-10-CM

## 2022-04-21 NOTE — Progress Notes (Signed)
  Subjective:  Patient ID: Janet Nielsen, female    DOB: 1971-07-29,  MRN: [id number]  Chief Complaint  Patient presents with   Plantar Fasciitis    1 month follow up left foot    51 y.o. female presents with the above complaint. History confirmed with patient.   Interval History: She is doing much better she has almost no pain the physical therapy has been very helpful she wear the boot until last week and is back to regular shoe gear and is comfortable  Objective:  Physical Exam: warm, good capillary refill, no trophic changes or ulcerative lesions, normal DP and PT pulses, and normal sensory exam. Left Foot: No pain today  No images are attached to the encounter.  Radiographs: Multiple views x-ray of both feet: no fracture, dislocation, swelling or degenerative changes noted and plantar calcaneal spur Assessment:   1. Plantar fasciitis of left foot       Plan:  Patient was evaluated and treated and all questions answered.  She is doing much better.  Physical therapy has been helpful.  May continue regular shoe gear and activity.  She will complete her therapy plan and continue home therapy advised her to continue this long-term to prevent recurrence and keep her Planter fasciitis in a quiescent state.  She will return to see me as needed if it does not improve or worsens at any point  Return if symptoms worsen or fail to improve.

## 2022-05-25 IMAGING — DX DG HIP (WITH OR WITHOUT PELVIS) 2-3V*L*
3 series · 3 of 3 positions shown · non-contrast
Comparison: None.

CLINICAL DATA: Left hip pain.  No injury.

EXAM:
DG HIP (WITH OR WITHOUT PELVIS) 2-3V LEFT

[pelvis ap]
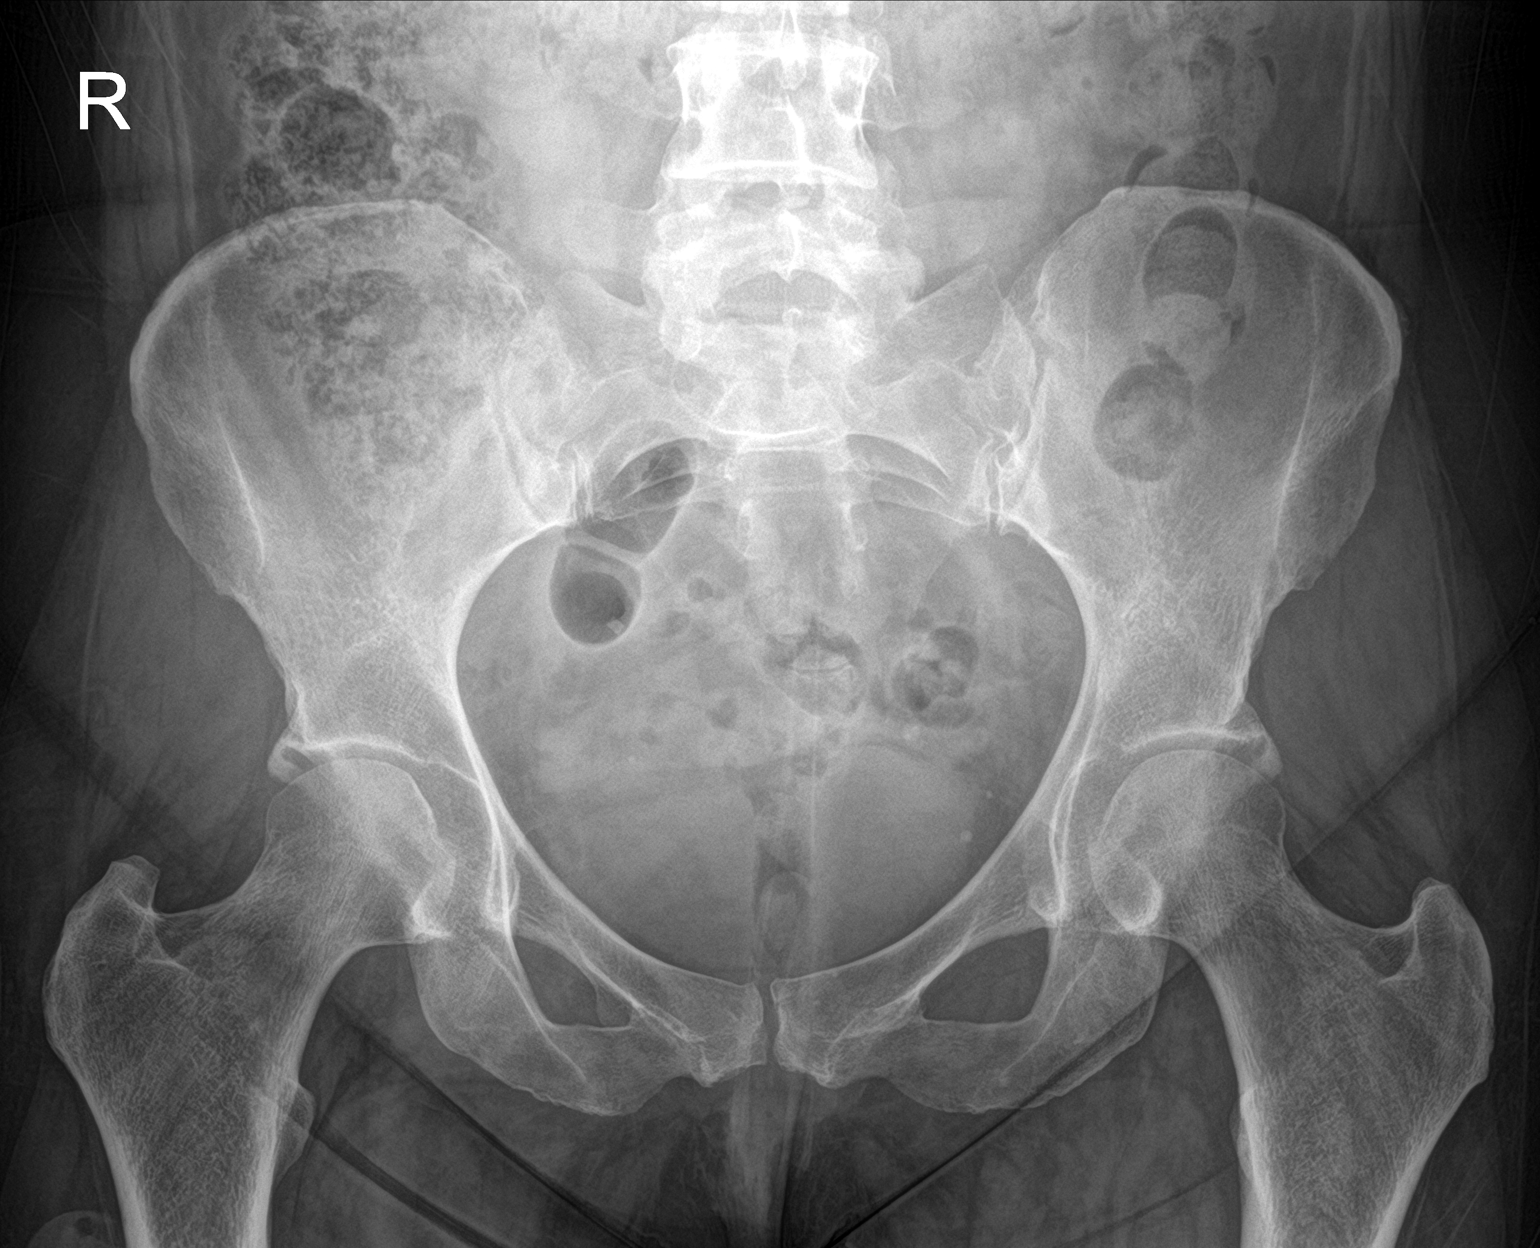

[hip ap]
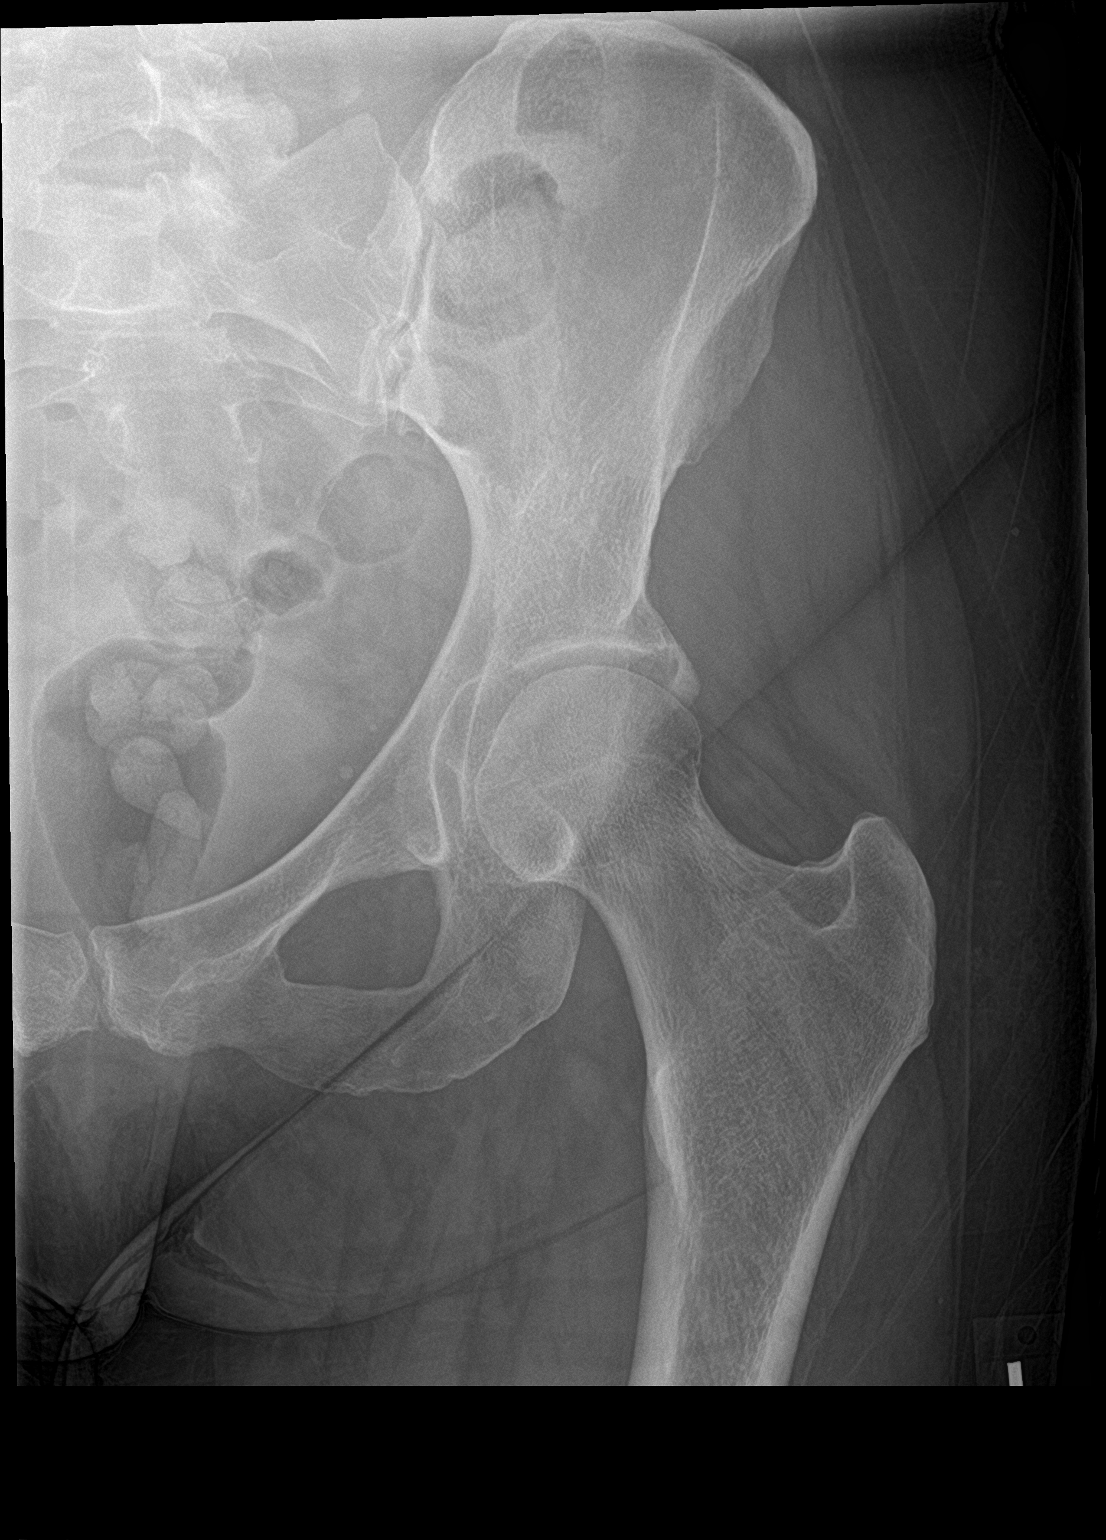

[hip frog leg]
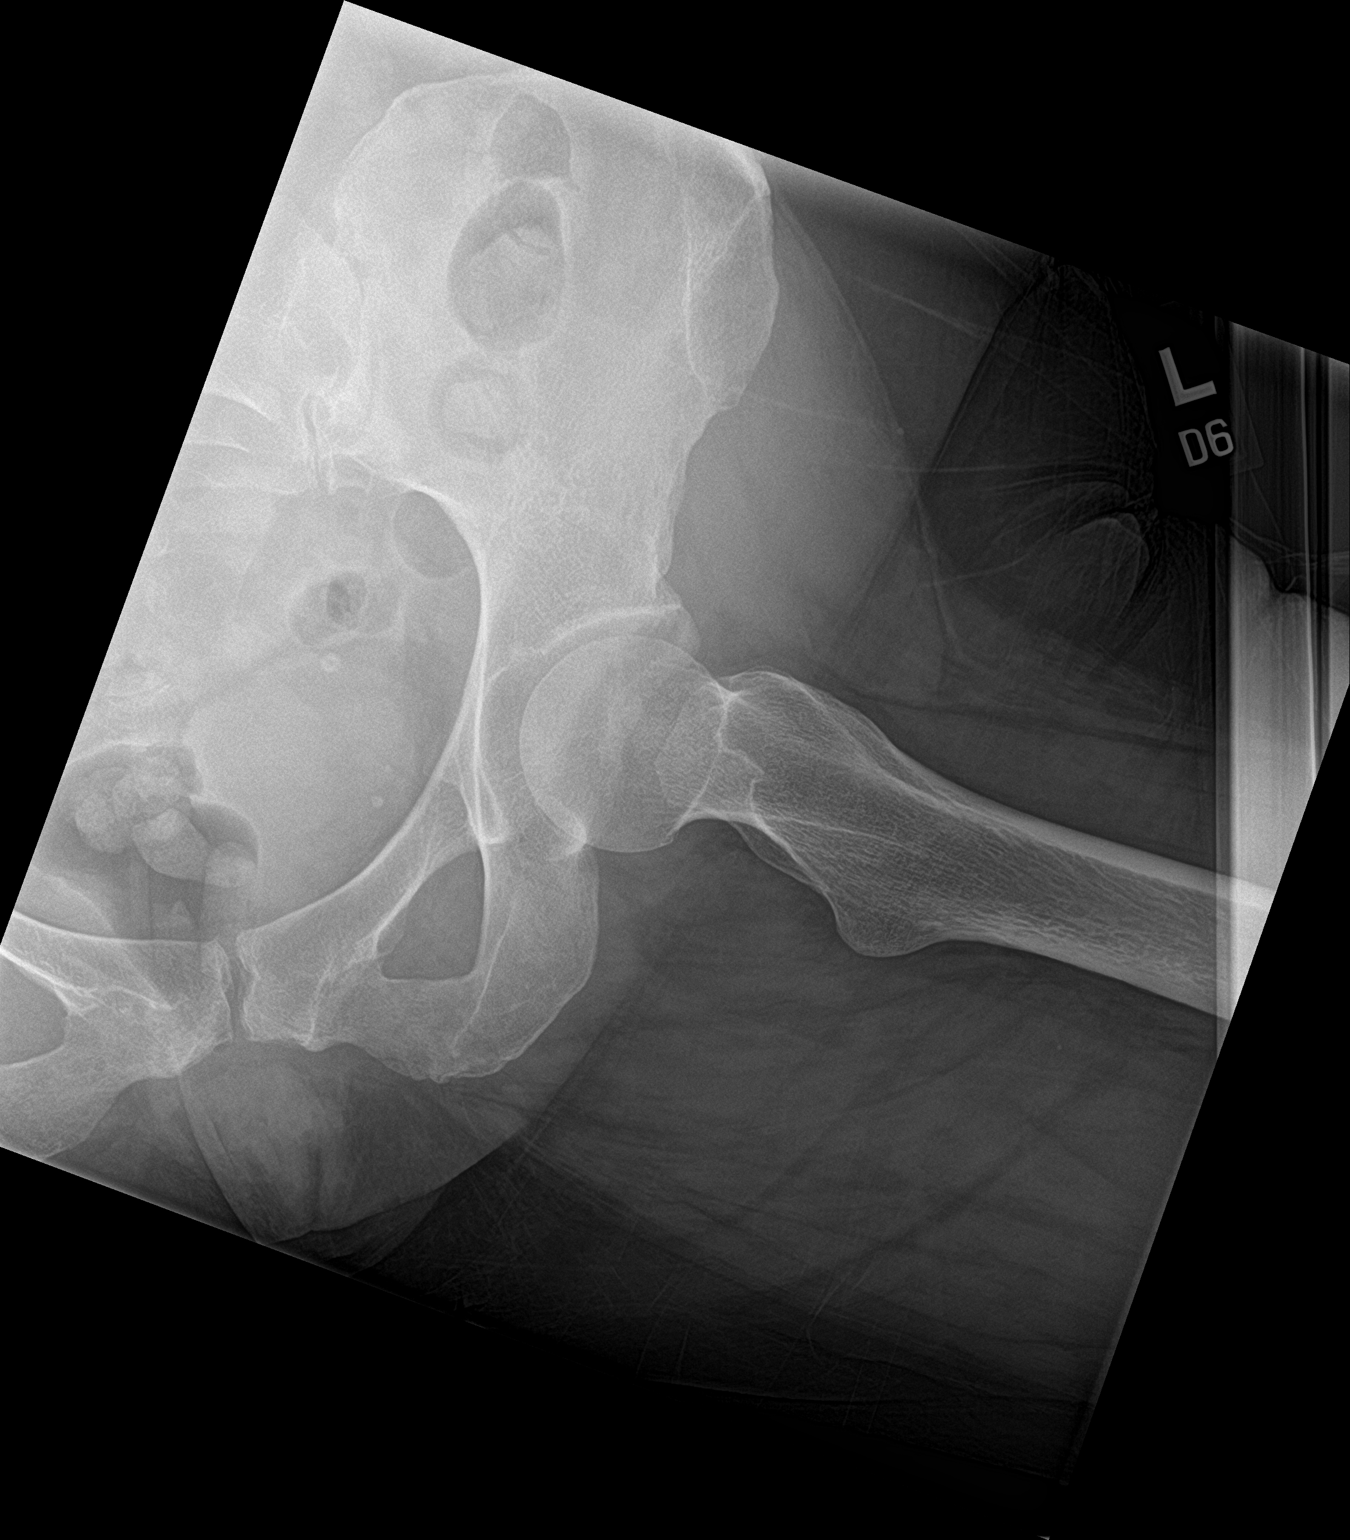

[3 of 3 positions shown; findings below may reference images not displayed]

FINDINGS: Hips are symmetric and within normal. No significant degenerative
change. No fracture or dislocation. No focal lytic or sclerotic
lesion. Minimal degenerate change of the sacroiliac joints.
IMPRESSION: No acute findings.

## 2022-07-13 ENCOUNTER — Ambulatory Visit (INDEPENDENT_AMBULATORY_CARE_PROVIDER_SITE_OTHER): Payer: Self-pay | Admitting: Dermatology

## 2022-07-13 DIAGNOSIS — L988 Other specified disorders of the skin and subcutaneous tissue: Secondary | ICD-10-CM

## 2022-07-13 NOTE — Patient Instructions (Signed)
Due to recent changes in healthcare laws, you may see results of your pathology and/or laboratory studies on MyChart before the doctors have had a chance to review them. We understand that in some cases there may be results that are confusing or concerning to you. Please understand that not all results are received at the same time and often the doctors may need to interpret multiple results in order to provide you with the best plan of care or course of treatment. Therefore, we ask that you please give us 2 business days to thoroughly review all your results before contacting the office for clarification. Should we see a critical lab result, you will be contacted sooner.   If You Need Anything After Your Visit  If you have any questions or concerns for your doctor, please call our main line at [phone] and press option 4 to reach your doctor's medical assistant. If no one answers, please leave a voicemail as directed and we will return your call as soon as possible. Messages left after 4 pm will be answered the following business day.   You may also send us a message via MyChart. We typically respond to MyChart messages within 1-2 business days.  For prescription refills, please ask your pharmacy to contact our office. Our fax number is [phone].  If you have an urgent issue when the clinic is closed that cannot wait until the next business day, you can page your doctor at the number below.    Please note that while we do our best to be available for urgent issues outside of office hours, we are not available 24/7.   If you have an urgent issue and are unable to reach us, you may choose to seek medical care at your doctor's office, retail clinic, urgent care center, or emergency room.  If you have a medical emergency, please immediately call 911 or go to the emergency department.  Pager Numbers  - Dr. Kowalski: [phone]  - Dr. Moye: [phone]  - Dr. Stewart:  [phone]  In the event of inclement weather, please call our main line at [phone] for an update on the status of any delays or closures.  Dermatology Medication Tips: Please keep the boxes that topical medications come in in order to help keep track of the instructions about where and how to use these. Pharmacies typically print the medication instructions only on the boxes and not directly on the medication tubes.   If your medication is too expensive, please contact our office at [phone] option 4 or send us a message through MyChart.   We are unable to tell what your co-pay for medications will be in advance as this is different depending on your insurance coverage. However, we may be able to find a substitute medication at lower cost or fill out paperwork to get insurance to cover a needed medication.   If a prior authorization is required to get your medication covered by your insurance company, please allow us 1-2 business days to complete this process.  Drug prices often vary depending on where the prescription is filled and some pharmacies may offer cheaper prices.  The website www.goodrx.com contains coupons for medications through different pharmacies. The prices here do not account for what the cost may be with help from insurance (it may be cheaper with your insurance), but the website can give you the price if you did not use any insurance.  - You can print the associated coupon and take it with   your prescription to the pharmacy.  - You may also stop by our office during regular business hours and pick up a GoodRx coupon card.  - If you need your prescription sent electronically to a different pharmacy, notify our office through [facility] MyChart or by phone at [phone] option 4.     Si Usted Necesita Algo Despus de Su Visita  Tambin puede enviarnos un mensaje a travs de MyChart. Por lo general respondemos a los mensajes de MyChart en el transcurso de 1 a 2  das hbiles.  Para renovar recetas, por favor pida a su farmacia que se ponga en contacto con nuestra oficina. Nuestro nmero de fax es el [phone].  Si tiene un asunto urgente cuando la clnica est cerrada y que no puede esperar hasta el siguiente da hbil, puede llamar/localizar a su doctor(a) al nmero que aparece a continuacin.   Por favor, tenga en cuenta que aunque hacemos todo lo posible para estar disponibles para asuntos urgentes fuera del horario de oficina, no estamos disponibles las 24 horas del da, los 7 das de la semana.   Si tiene un problema urgente y no puede comunicarse con nosotros, puede optar por buscar atencin mdica  en el consultorio de su doctor(a), en una clnica privada, en un centro de atencin urgente o en una sala de emergencias.  Si tiene una emergencia mdica, por favor llame inmediatamente al 911 o vaya a la sala de emergencias.  Nmeros de bper  - Dr. Kowalski: [phone]  - Dra. Moye: [phone]  - Dra. Stewart: [phone]  En caso de inclemencias del tiempo, por favor llame a nuestra lnea principal al [phone] para una actualizacin sobre el estado de cualquier retraso o cierre.  Consejos para la medicacin en dermatologa: Por favor, guarde las cajas en las que vienen los medicamentos de uso tpico para ayudarle a seguir las instrucciones sobre dnde y cmo usarlos. Las farmacias generalmente imprimen las instrucciones del medicamento slo en las cajas y no directamente en los tubos del medicamento.   Si su medicamento es muy caro, por favor, pngase en contacto con nuestra oficina llamando al [phone] y presione la opcin 4 o envenos un mensaje a travs de MyChart.   No podemos decirle cul ser su copago por los medicamentos por adelantado ya que esto es diferente dependiendo de la cobertura de su seguro. Sin embargo, es posible que podamos encontrar un medicamento sustituto a menor costo o llenar un formulario para que el  seguro cubra el medicamento que se considera necesario.   Si se requiere una autorizacin previa para que su compaa de seguros cubra su medicamento, por favor permtanos de 1 a 2 das hbiles para completar este proceso.  Los precios de los medicamentos varan con frecuencia dependiendo del lugar de dnde se surte la receta y alguna farmacias pueden ofrecer precios ms baratos.  El sitio web www.goodrx.com tiene cupones para medicamentos de diferentes farmacias. Los precios aqu no tienen en cuenta lo que podra costar con la ayuda del seguro (puede ser ms barato con su seguro), pero el sitio web puede darle el precio si no utiliz ningn seguro.  - Puede imprimir el cupn correspondiente y llevarlo con su receta a la farmacia.  - Tambin puede pasar por nuestra oficina durante el horario de atencin regular y recoger una tarjeta de cupones de GoodRx.  - Si necesita que su receta se enve electrnicamente a una farmacia diferente, informe a nuestra oficina a travs de MyChart [facility]   o por telfono llamando al [phone] y presione la opcin 4.  

## 2022-07-13 NOTE — Progress Notes (Signed)
   Follow-Up Visit   Subjective  Janet Nielsen is a 51 y.o. female who presents for the following: Facial Elastosis (Botox today).  The following portions of the chart were reviewed this encounter and updated as appropriate:   Tobacco  Allergies  Meds  Problems  Med Hx  Surg Hx  Fam Hx     Review of Systems:  No other skin or systemic complaints except as noted in HPI or Assessment and Plan.  Objective  Well appearing patient in no apparent distress; mood and affect are within normal limits.  A focused examination was performed including face. Relevant physical exam findings are noted in the Assessment and Plan.  Face Rhytides and volume loss.       Assessment & Plan  Elastosis of skin Face  Botox today - 55 units total  Frown Complex - 30 units Forehead - 7.5 units Crow's Feet - 7.5 units each side Left Botox Comma - 2.5 units total (2 injections)  Botox Injection - Face Location: See attached image  Informed consent: Discussed risks (infection, pain, bleeding, bruising, swelling, allergic reaction, paralysis of nearby muscles, eyelid droop, double vision, neck weakness, difficulty breathing, headache, undesirable cosmetic result, and need for additional treatment) and benefits of the procedure, as well as the alternatives.  Informed consent was obtained.  Preparation: The area was cleansed with alcohol.  Procedure Details:  Botox was injected into the dermis with a 30-gauge needle. Pressure applied to any bleeding. Ice packs offered for swelling.  Lot Number:  [id number] Expiration:  09/2024  Total Units Injected:  55  Plan: Patient was instructed to remain upright for 4 hours. Patient was instructed to avoid massaging the face and avoid vigorous exercise for the rest of the day. Tylenol may be used for headache.  Allow 2 weeks before returning to clinic for additional dosing as needed. Patient will call for any problems.    Return today (on 07/13/2022) for  Botox in 3-4 months.  I, Ashok Cordia, CMA, am acting as scribe for Sarina Ser, MD . Documentation: I have reviewed the above documentation for accuracy and completeness, and I agree with the above.  Sarina Ser, MD

## 2022-07-14 ENCOUNTER — Ambulatory Visit: Payer: PRIVATE HEALTH INSURANCE

## 2022-07-14 ENCOUNTER — Ambulatory Visit (INDEPENDENT_AMBULATORY_CARE_PROVIDER_SITE_OTHER): Payer: PRIVATE HEALTH INSURANCE | Admitting: Podiatry

## 2022-07-14 ENCOUNTER — Ambulatory Visit (INDEPENDENT_AMBULATORY_CARE_PROVIDER_SITE_OTHER): Payer: PRIVATE HEALTH INSURANCE

## 2022-07-14 ENCOUNTER — Other Ambulatory Visit: Payer: PRIVATE HEALTH INSURANCE

## 2022-07-14 ENCOUNTER — Ambulatory Visit: Payer: PRIVATE HEALTH INSURANCE | Admitting: Podiatry

## 2022-07-14 DIAGNOSIS — M722 Plantar fascial fibromatosis: Secondary | ICD-10-CM | POA: Diagnosis not present

## 2022-07-14 DIAGNOSIS — M7731 Calcaneal spur, right foot: Secondary | ICD-10-CM | POA: Diagnosis not present

## 2022-07-14 NOTE — Progress Notes (Signed)
  Subjective:  Patient ID: Janet Nielsen, female    DOB: 1971/01/16,  MRN: [id number]  Chief Complaint  Patient presents with   Plantar Fasciitis    Patient came in today for orthotic casting, patient is till having pain in the arch and the heel, rate of pain 3 out of 10, X-rays taken today     51 y.o. female presents with the above complaint. History confirmed with patient.   Interval History: She is improved is much better than it used to be but still has a 2-3 out of 10 pain fairly consistently mostly under the heel  Objective:  Physical Exam: warm, good capillary refill, no trophic changes or ulcerative lesions, normal DP and PT pulses, and normal sensory exam.  Bilaterally has slight plantar heel pain to palpation   Radiographs: Multiple views x-ray of both feet: no fracture, dislocation, swelling or degenerative changes noted and plantar calcaneal spur Assessment:   1. Plantar fasciitis of left foot   2. Heel spur, right       Plan:  Patient was evaluated and treated and all questions answered.  Overall had improved quite a bit with physical therapy, long-term we had discussed using orthotics to support her foot deformity, offload the plantar fascia and heel.  She was fitted and casted through these today.  We will plan for fashioning these for a pair for work shoes, her sneakers that she uses on a daily basis for exercise such as her Hoka's are fairly comfortable for her.  No follow-ups on file.

## 2022-07-25 ENCOUNTER — Encounter: Payer: Self-pay | Admitting: Dermatology

## 2022-08-25 ENCOUNTER — Ambulatory Visit (INDEPENDENT_AMBULATORY_CARE_PROVIDER_SITE_OTHER): Payer: PRIVATE HEALTH INSURANCE

## 2022-08-25 DIAGNOSIS — M722 Plantar fascial fibromatosis: Secondary | ICD-10-CM

## 2022-08-25 NOTE — Progress Notes (Signed)
Patient presents today to pick up custom molded foot orthotics, diagnosed with Plantar Fasciitis  by Dr.Mcdonald  Orthotics were dispensed and fit was satisfactory. Reviewed instructions for break-in and wear. Written instructions given to patient.  Patient will follow up as needed.   [facility] - order # [id number]

## 2022-10-19 ENCOUNTER — Ambulatory Visit: Payer: Self-pay | Admitting: Dermatology

## 2022-11-24 ENCOUNTER — Encounter: Payer: Self-pay | Admitting: Dermatology

## 2022-11-24 ENCOUNTER — Ambulatory Visit (INDEPENDENT_AMBULATORY_CARE_PROVIDER_SITE_OTHER): Payer: PRIVATE HEALTH INSURANCE | Admitting: Dermatology

## 2022-11-24 VITALS — BP 114/84 | HR 74

## 2022-11-24 DIAGNOSIS — L821 Other seborrheic keratosis: Secondary | ICD-10-CM

## 2022-11-24 DIAGNOSIS — L578 Other skin changes due to chronic exposure to nonionizing radiation: Secondary | ICD-10-CM

## 2022-11-24 DIAGNOSIS — Z808 Family history of malignant neoplasm of other organs or systems: Secondary | ICD-10-CM | POA: Diagnosis not present

## 2022-11-24 DIAGNOSIS — Z1283 Encounter for screening for malignant neoplasm of skin: Secondary | ICD-10-CM | POA: Diagnosis not present

## 2022-11-24 DIAGNOSIS — L814 Other melanin hyperpigmentation: Secondary | ICD-10-CM

## 2022-11-24 DIAGNOSIS — D229 Melanocytic nevi, unspecified: Secondary | ICD-10-CM

## 2022-11-24 NOTE — Patient Instructions (Addendum)
Discussed for brown spots at cheeks  Recommend 3 treatments   Counseling for BBL / IPL / Laser and Coordination of Care Discussed the treatment option of Broad Band Light (BBL) /Intense Pulsed Light (IPL)/ Laser for skin discoloration, including brown spots and redness.  Typically we recommend at least 1-3 treatment sessions about 5-8 weeks apart for best results.  Cannot have tanned skin when BBL performed, and regular use of sunscreen is advised after the procedure to help maintain results. The patient's condition may also require "maintenance treatments" in the future.  The fee for BBL / laser treatments is $350 per treatment session for the whole face.  A fee can be quoted for other parts of the body.  Insurance typically does not pay for BBL/laser treatments and therefore the fee is an out-of-pocket cost.     Seborrheic Keratosis  What causes seborrheic keratoses? Seborrheic keratoses are harmless, common skin growths that first appear during adult life.  As time goes by, more growths appear.  Some people may develop a large number of them.  Seborrheic keratoses appear on both covered and uncovered body parts.  They are not caused by sunlight.  The tendency to develop seborrheic keratoses can be inherited.  They vary in color from skin-colored to gray, brown, or even black.  They can be either smooth or have a rough, warty surface.   Seborrheic keratoses are superficial and look as if they were stuck on the skin.  Under the microscope this type of keratosis looks like layers upon layers of skin.  That is why at times the top layer may seem to fall off, but the rest of the growth remains and re-grows.    Treatment Seborrheic keratoses do not need to be treated, but can easily be removed in the office.  Seborrheic keratoses often cause symptoms when they rub on clothing or jewelry.  Lesions can be in the way of shaving.  If they become inflamed, they can cause itching, soreness, or burning.   Removal of a seborrheic keratosis can be accomplished by freezing, burning, or surgery. If any spot bleeds, scabs, or grows rapidly, please return to have it checked, as these can be an indication of a skin cancer.    Melanoma ABCDEs  Melanoma is the most dangerous type of skin cancer, and is the leading cause of death from skin disease.  You are more likely to develop melanoma if you: Have light-colored skin, light-colored eyes, or red or blond hair Spend a lot of time in the sun Tan regularly, either outdoors or in a tanning bed Have had blistering sunburns, especially during childhood Have a close family member who has had a melanoma Have atypical moles or large birthmarks  Early detection of melanoma is key since treatment is typically straightforward and cure rates are extremely high if we catch it early.   The first sign of melanoma is often a change in a mole or a new dark spot.  The ABCDE system is a way of remembering the signs of melanoma.  A for asymmetry:  The two halves do not match. B for border:  The edges of the growth are irregular. C for color:  A mixture of colors are present instead of an even brown color. D for diameter:  Melanomas are usually (but not always) greater than 53mm - the size of a pencil eraser. E for evolution:  The spot keeps changing in size, shape, and color.  Please check your skin once per  month between visits. You can use a small mirror in front and a large mirror behind you to keep an eye on the back side or your body.   If you see any new or changing lesions before your next follow-up, please call to schedule a visit.  Please continue daily skin protection including broad spectrum sunscreen SPF 30+ to sun-exposed areas, reapplying every 2 hours as needed when you're outdoors.   Staying in the shade or wearing long sleeves, sun glasses (UVA+UVB protection) and wide brim hats (4-inch brim around the entire circumference of the hat) are also  recommended for sun protection.    Due to recent changes in healthcare laws, you may see results of your pathology and/or laboratory studies on MyChart before the doctors have had a chance to review them. We understand that in some cases there may be results that are confusing or concerning to you. Please understand that not all results are received at the same time and often the doctors may need to interpret multiple results in order to provide you with the best plan of care or course of treatment. Therefore, we ask that you please give us 2 business days to thoroughly review all your results before contacting the office for clarification. Should we see a critical lab result, you will be contacted sooner.   If You Need Anything After Your Visit  If you have any questions or concerns for your doctor, please call our main line at [phone] and press option 4 to reach your doctor's medical assistant. If no one answers, please leave a voicemail as directed and we will return your call as soon as possible. Messages left after 4 pm will be answered the following business day.   You may also send us a message via MyChart. We typically respond to MyChart messages within 1-2 business days.  For prescription refills, please ask your pharmacy to contact our office. Our fax number is [phone].  If you have an urgent issue when the clinic is closed that cannot wait until the next business day, you can page your doctor at the number below.    Please note that while we do our best to be available for urgent issues outside of office hours, we are not available 24/7.   If you have an urgent issue and are unable to reach us, you may choose to seek medical care at your doctor's office, retail clinic, urgent care center, or emergency room.  If you have a medical emergency, please immediately call 911 or go to the emergency department.  Pager Numbers  - Dr. Gwen PoundsKowalski: [phone]  - Dr. Neale BurlyMoye:  [phone]  - Dr. Roseanne RenoStewart: [phone]  In the event of inclement weather, please call our main line at [phone] for an update on the status of any delays or closures.  Dermatology Medication Tips: Please keep the boxes that topical medications come in in order to help keep track of the instructions about where and how to use these. Pharmacies typically print the medication instructions only on the boxes and not directly on the medication tubes.   If your medication is too expensive, please contact our office at [phone] option 4 or send us a message through MyChart.   We are unable to tell what your co-pay for medications will be in advance as this is different depending on your insurance coverage. However, we may be able to find a substitute medication at lower cost or fill out paperwork to get insurance to cover a  needed medication.   If a prior authorization is required to get your medication covered by your insurance company, please allow [place] 1-2 business days to complete this process.  Drug prices often vary depending on where the prescription is filled and some pharmacies may offer cheaper prices.  The website www.goodrx.com contains coupons for medications through different pharmacies. The prices here do not account for what the cost may be with help from insurance (it may be cheaper with your insurance), but the website can give you the price if you did not use any insurance.  - You can print the associated coupon and take it with your prescription to the pharmacy.  - You may also stop by our office during regular business hours and pick up a GoodRx coupon card.  - If you need your prescription sent electronically to a different pharmacy, notify our office through [facility] or by phone at [phone] option 4.     Si Usted Necesita Algo Despus de Su Visita  Tambin puede enviarnos un mensaje a travs de Clinical cytogeneticist. Por lo general respondemos a los mensajes de  MyChart en el transcurso de 1 a 2 das hbiles.  Para renovar recetas, por favor pida a su farmacia que se ponga en contacto con nuestra oficina. Annie Sable de fax es [place] [phone].  Si tiene un asunto urgente cuando la clnica est cerrada y que no puede esperar hasta el siguiente da hbil, puede llamar/localizar a su doctor(a) al nmero que aparece a continuacin.   Por favor, tenga en cuenta que aunque hacemos todo lo posible para estar disponibles para asuntos urgentes fuera del horario de [place], no estamos disponibles las 24 horas del da, los 7 [address]  [address] de la [place].   Si tiene un problema urgente y no puede comunicarse con nosotros, puede optar por buscar atencin mdica  en el consultorio de su doctor(a), en una clnica privada, en un centro de atencin urgente o en una sala de emergencias.  Si tiene Engineer, drilling, por favor llame inmediatamente al 911 o vaya a la sala de emergencias.  Nmeros de bper  - Dr. Gwen Pounds: [phone]  - Dra. Moye: [phone]  - Dra. Roseanne Reno: [phone]  En caso de inclemencias del [place], por favor llame a Lacy Duverney principal al [phone] para una actualizacin sobre el [place] de cualquier retraso o cierre.  Consejos para la medicacin en dermatologa: Por favor, guarde las cajas en las que vienen los medicamentos de uso tpico para ayudarle a seguir las instrucciones sobre dnde y cmo usarlos. Las farmacias generalmente imprimen las instrucciones del medicamento slo en las cajas y no directamente en los tubos del [place].   Si su medicamento es muy caro, por favor, pngase en contacto con Rolm Gala llamando al [phone] y presione la opcin 4 o envenos un mensaje a travs de Clinical cytogeneticist.   No podemos decirle cul ser su copago por los medicamentos por adelantado ya que esto es diferente dependiendo de la cobertura de su seguro. Sin embargo, es posible que podamos encontrar un medicamento sustituto a Insurance account manager un formulario para que el seguro cubra el medicamento que se considera necesario.   Si se requiere una autorizacin previa para que su compaa de seguros [place] su medicamento, por favor permtanos de 1 a 2 das hbiles para completar [address]  Los precios de los medicamentos varan con frecuencia dependiendo del Environmental consultant de dnde se surte la receta y alguna farmacias pueden ofrecer precios ms baratos.  El sitio web www.goodrx.com tiene cupones para medicamentos de Airline pilot. Los precios aqu no tienen en cuenta lo que podra costar con la ayuda del seguro (puede ser ms barato con su seguro), pero el sitio web puede darle el precio si no utiliz Research scientist (physical sciences).  - Puede imprimir el cupn correspondiente y llevarlo con su receta a la farmacia.  - Tambin puede pasar por nuestra oficina durante el horario de atencin regular y Charity fundraiser una tarjeta de cupones de GoodRx.  - Si necesita que su receta se enve electrnicamente a una farmacia diferente, informe a nuestra oficina a travs de MyChart de [place] o por telfono llamando al [phone] y presione la opcin 4.

## 2022-11-24 NOTE — Progress Notes (Signed)
   Follow-Up Visit   Subjective  Janet Nielsen is a 52 y.o. female who presents for the following: Skin Cancer Screening and Full Body Skin Exam Noticed a spot at left cheek 2 months ago she would like checked.   The patient presents for Total-Body Skin Exam (TBSE) for skin cancer screening and mole check. The patient has spots, moles and lesions to be evaluated, some may be new or changing and the patient has concerns that these could be cancer.    The following portions of the chart were reviewed this encounter and updated as appropriate: medications, allergies, medical history  Review of Systems:  No other skin or systemic complaints except as noted in HPI or Assessment and Plan.  Objective  Well appearing patient in no apparent distress; mood and affect are within normal limits.  A full examination was performed including scalp, head, eyes, ears, nose, lips, neck, chest, axillae, abdomen, back, buttocks, bilateral upper extremities, bilateral lower extremities, hands, feet, fingers, toes, fingernails, and toenails. All findings within normal limits unless otherwise noted below.   Relevant physical exam findings are noted in the Assessment and Plan.    Assessment & Plan   LENTIGINES, SEBORRHEIC KERATOSES and left cheek and cheeks , HEMANGIOMAS - Benign normal skin lesions - Benign-appearing - Call for any changes  Discussed for Sks at cheeks  Counseling for BBL / IPL / Laser and Coordination of Care Discussed the treatment option of Broad Band Light (BBL) /Intense Pulsed Light (IPL)/ Laser for skin discoloration, including brown spots and redness.  Typically we recommend at least 1-3 treatment sessions about 5-8 weeks apart for best results.  Cannot have tanned skin when BBL performed, and regular use of sunscreen is advised after the procedure to help maintain results. The patient's condition may also require "maintenance treatments" in the future.  The fee for BBL / laser  treatments is $350 per treatment session for the whole face.  A fee can be quoted for other parts of the body.  Insurance typically does not pay for BBL/laser treatments and therefore the fee is an out-of-pocket cost.   MELANOCYTIC NEVI - Tan-brown and/or pink-flesh-colored symmetric macules and papules - Benign appearing on exam today - Observation - Call clinic for new or changing moles - Recommend daily use of broad spectrum spf 30+ sunscreen to sun-exposed areas.   ACTINIC DAMAGE - Chronic condition, secondary to cumulative UV/sun exposure - diffuse scaly erythematous macules with underlying dyspigmentation - Recommend daily broad spectrum sunscreen SPF 30+ to sun-exposed areas, reapply every 2 hours as needed.  - Staying in the shade or wearing long sleeves, sun glasses (UVA+UVB protection) and wide brim hats (4-inch brim around the entire circumference of the hat) are also recommended for sun protection.  - Call for new or changing lesions.  FAMILY HISTORY OF SKIN CANCER What type(s):bcc  metastatic melanoma Who affected:grandmother dad's cousin    SKIN CANCER SCREENING PERFORMED TODAY.  Return in about 1 year (around 11/24/2023) for TBSE.  IAsher Muir, CMA, am acting as scribe for Armida Sans, MD.   Documentation: I have reviewed the above documentation for accuracy and completeness, and I agree with the above.  Armida Sans, MD

## 2022-12-12 ENCOUNTER — Encounter: Payer: Self-pay | Admitting: Dermatology

## 2023-03-10 ENCOUNTER — Ambulatory Visit (INDEPENDENT_AMBULATORY_CARE_PROVIDER_SITE_OTHER): Payer: 59 | Admitting: Dermatology

## 2023-03-10 VITALS — BP 126/89 | HR 65

## 2023-03-10 DIAGNOSIS — S40861A Insect bite (nonvenomous) of right upper arm, initial encounter: Secondary | ICD-10-CM | POA: Diagnosis not present

## 2023-03-10 DIAGNOSIS — Z7189 Other specified counseling: Secondary | ICD-10-CM

## 2023-03-10 DIAGNOSIS — Z79899 Other long term (current) drug therapy: Secondary | ICD-10-CM

## 2023-03-10 DIAGNOSIS — S40862A Insect bite (nonvenomous) of left upper arm, initial encounter: Secondary | ICD-10-CM | POA: Diagnosis not present

## 2023-03-10 DIAGNOSIS — S80861A Insect bite (nonvenomous), right lower leg, initial encounter: Secondary | ICD-10-CM | POA: Diagnosis not present

## 2023-03-10 DIAGNOSIS — L988 Other specified disorders of the skin and subcutaneous tissue: Secondary | ICD-10-CM

## 2023-03-10 DIAGNOSIS — S80862A Insect bite (nonvenomous), left lower leg, initial encounter: Secondary | ICD-10-CM

## 2023-03-10 DIAGNOSIS — W57XXXA Bitten or stung by nonvenomous insect and other nonvenomous arthropods, initial encounter: Secondary | ICD-10-CM

## 2023-03-10 MED ORDER — MUPIROCIN 2 % EX OINT: TOPICAL_OINTMENT | CUTANEOUS | 1 refills | Status: AC

## 2023-03-10 MED ORDER — CLOBETASOL PROPIONATE 0.05 % EX CREA: TOPICAL_CREAM | CUTANEOUS | 1 refills | Status: AC

## 2023-03-10 NOTE — Patient Instructions (Addendum)
Discussed insect shield - a company sell clothing that prevent bugs   Start mupirocin ointment to any open wounds/sores or scabs daily until healed.  Start clobetasol 2 % cream - apply topically to itchy bites at body twice daily for 2 weeks as needed. If no longer flared can discontinue. If still flared continue using to affected areas twice daily 5 days a week.   Avoid applying to face, groin, and axilla. Use as directed. Long-term use can cause thinning of the skin.  Topical steroids (such as triamcinolone, fluocinolone, fluocinonide, mometasone, clobetasol, halobetasol, betamethasone, hydrocortisone) can cause thinning and lightening of the skin if they are used for too long in the same area. Your physician has selected the right strength medicine for your problem and area affected on the body. Please use your medication only as directed by your physician to prevent side effects.          Due to recent changes in healthcare laws, you may see results of your pathology and/or laboratory studies on MyChart before the doctors have had a chance to review them. We understand that in some cases there may be results that are confusing or concerning to you. Please understand that not all results are received at the same time and often the doctors may need to interpret multiple results in order to provide you with the best plan of care or course of treatment. Therefore, we ask that you please give [place] 2 business days to thoroughly review all your results before contacting the office for clarification. Should we see a critical lab result, you will be contacted sooner.   If You Need Anything After Your Visit  If you have any questions or concerns for your doctor, please call our main line at [phone] and press option 4 to reach your doctor's medical assistant. If no one answers, please leave a voicemail as directed and we will return your call as soon as possible. Messages left after 4 pm will be  answered the following business day.   You may also send [place] a message via MyChart. We typically respond to MyChart messages within 1-2 business days.  For prescription refills, please ask your pharmacy to contact our office. Our fax number is [phone].  If you have an urgent issue when the clinic is closed that cannot wait until the next business day, you can page your doctor at the number below.    Please note that while we do our best to be available for urgent issues outside of office hours, we are not available 24/7.   If you have an urgent issue and are unable to reach [place], you may choose to seek medical care at your doctor's office, retail clinic, urgent care center, or emergency room.  If you have a medical emergency, please immediately call 911 or go to the emergency department.  Pager Numbers  - Dr. Gwen Pounds: [phone]  - Dr. Neale Burly: [phone]  - Dr. Roseanne Reno: [phone]  In the event of inclement weather, please call our main line at [phone] for an update on the status of any delays or closures.  Dermatology Medication Tips: Please keep the boxes that topical medications come in in order to help keep track of the instructions about where and how to use these. Pharmacies typically print the medication instructions only on the boxes and not directly on the medication tubes.   If your medication is too expensive, please contact our office at [phone] option 4 or send [place] a message through  MyChart.   We are unable to tell what your co-pay for medications will be in advance as this is different depending on your insurance coverage. However, we may be able to find a substitute medication at lower cost or fill out paperwork to get insurance to cover a needed medication.   If a prior authorization is required to get your medication covered by your insurance company, please allow [place] 1-2 business days to complete this process.  Drug prices often vary depending on  where the prescription is filled and some pharmacies may offer cheaper prices.  The website www.goodrx.com contains coupons for medications through different pharmacies. The prices here do not account for what the cost may be with help from insurance (it may be cheaper with your insurance), but the website can give you the price if you did not use any insurance.  - You can print the associated coupon and take it with your prescription to the pharmacy.  - You may also stop by our office during regular business hours and pick up a GoodRx coupon card.  - If you need your prescription sent electronically to a different pharmacy, notify our office through [facility] or by phone at [phone] option 4.     Si Usted Necesita Algo Despus de Su Visita  Tambin puede enviarnos un mensaje a travs de Clinical cytogeneticist. Por lo general respondemos a los mensajes de MyChart en el transcurso de 1 a 2 das hbiles.  Para renovar recetas, por favor pida a su farmacia que se ponga en contacto con nuestra oficina. Annie Sable de fax es [place] [phone].  Si tiene un asunto urgente cuando la clnica est cerrada y que no puede esperar hasta el siguiente da hbil, puede llamar/localizar a su doctor(a) al nmero que aparece a continuacin.   Por favor, tenga en cuenta que aunque hacemos todo lo posible para estar disponibles para asuntos urgentes fuera del horario de [place], no estamos disponibles las 24 horas del da, los 7 [address]  [address] de la [place].   Si tiene un problema urgente y no puede comunicarse con nosotros, puede optar por buscar atencin mdica  en el consultorio de su doctor(a), en una clnica privada, en un centro de atencin urgente o en una sala de emergencias.  Si tiene Engineer, drilling, por favor llame inmediatamente al 911 o vaya a la sala de emergencias.  Nmeros de bper  - Dr. Gwen Pounds: [phone]  - Dra. Moye: [phone]  - Dra. Roseanne Reno: [phone]  En caso de inclemencias  del [place], por favor llame a Lacy Duverney principal al [phone] para una actualizacin sobre el [place] de cualquier retraso o cierre.  Consejos para la medicacin en dermatologa: Por favor, guarde las cajas en las que vienen los medicamentos de uso tpico para ayudarle a seguir las instrucciones sobre dnde y cmo usarlos. Las farmacias generalmente imprimen las instrucciones del medicamento slo en las cajas y no directamente en los tubos del [place].   Si su medicamento es muy caro, por favor, pngase en contacto con Rolm Gala llamando al [phone] y presione la opcin 4 o envenos un mensaje a travs de Clinical cytogeneticist.   No podemos decirle cul ser su copago por los medicamentos por adelantado ya que esto es diferente dependiendo de la cobertura de su seguro. Sin embargo, es posible que podamos encontrar un medicamento sustituto a Audiological scientist un formulario para que el seguro cubra el medicamento que se considera necesario.   Si se requiere una autorizacin previa  para que su compaa de seguros [place] su medicamento, por favor permtanos de 1 a 2 das hbiles para completar [address]  Los precios de los medicamentos varan con frecuencia dependiendo del Environmental consultant de dnde se surte la receta y alguna farmacias pueden ofrecer precios ms baratos.  El sitio web www.goodrx.com tiene cupones para medicamentos de Health and safety inspector. Los precios aqu no tienen en cuenta lo que podra costar con la ayuda del seguro (puede ser ms barato con su seguro), pero el sitio web puede darle el precio si no utiliz Tourist information centre manager.  - Puede imprimir el cupn correspondiente y llevarlo con su receta a la farmacia.  - Tambin puede pasar por nuestra oficina durante el horario de atencin regular y Education officer, museum una tarjeta de cupones de GoodRx.  - Si necesita que su receta se enve electrnicamente a una farmacia diferente, informe a nuestra oficina a travs de MyChart de [place] o por telfono  llamando al [phone] y presione la opcin 4.

## 2023-03-10 NOTE — Progress Notes (Signed)
   Follow-Up Visit   Subjective  Janet Nielsen is a 52 y.o. female who presents for the following: Botox for facial elastosis Pt also complains of bites and wonders about treatment and prevention.  The following portions of the chart were reviewed this encounter and updated as appropriate: medications, allergies, medical history  Review of Systems:  No other skin or systemic complaints except as noted in HPI or Assessment and Plan.  Objective  Well appearing patient in no apparent distress; mood and affect are within normal limits.  A focused examination was performed of the face.  Relevant physical exam findings are noted in the Assessment and Plan.      Assessment & Plan   Bug bite, initial encounter  Related Medications mupirocin ointment (BACTROBAN) 2 % Apply to any open sores/wounds or scabs on body twice daily until healed.  clobetasol cream (TEMOVATE) 0.05 % Apply topically to aa of bug bites twice daily for 2 weeks prn. Avoid applying to face, groin, and axilla. Use as directed. Long-term use can cause thinning of the skin.   Facial Elastosis  Location: See attached image  Informed consent: Discussed risks (infection, pain, bleeding, bruising, swelling, allergic reaction, paralysis of nearby muscles, eyelid droop, double vision, neck weakness, difficulty breathing, headache, undesirable cosmetic result, and need for additional treatment) and benefits of the procedure, as well as the alternatives.  Informed consent was obtained.  Preparation: The area was cleansed with alcohol.  Procedure Details:  Botox was injected into the dermis with a 30-gauge needle. Pressure applied to any bleeding. Ice packs offered for swelling.  Lot Number:  [id number] Expiration:  01/2025   Total Units Injected:  48.75  Plan: Tylenol may be used for headache.  Allow 2 weeks before returning to clinic for additional dosing as needed. Patient will call for any problems.  Bite reaction  from mosquitos  B/l arms, b/l legs Exam: pink papules   Treatment Plan: Start clobetasol cream 0.05 % Apply twice daily for 2 weeks then if still flared continue 5 days weekly Avoid applying to face, groin, and axilla. Use as directed. Long-term use can cause thinning of the skin.  Topical steroids (such as triamcinolone, fluocinolone, fluocinonide, mometasone, clobetasol, halobetasol, betamethasone, hydrocortisone) can cause thinning and lightening of the skin if they are used for too long in the same area. Your physician has selected the right strength medicine for your problem and area affected on the body. Please use your medication only as directed by your physician to prevent side effects.   Start mupirocin 2 % ointment apply to any open wounds or scabbed areas daily until healed.  Discussed Insect shield sell clothing that prevent bugs    No follow-ups on file.  IAsher Muir, CMA, am acting as scribe for Armida Sans, MD.  Documentation: I have reviewed the above documentation for accuracy and completeness, and I agree with the above.  Armida Sans, MD

## 2023-03-14 ENCOUNTER — Encounter: Payer: Self-pay | Admitting: Dermatology

## 2023-04-14 ENCOUNTER — Ambulatory Visit: Payer: 59 | Admitting: Dermatology

## 2023-04-26 ENCOUNTER — Ambulatory Visit: Payer: 59 | Admitting: Dermatology

## 2023-04-27 ENCOUNTER — Ambulatory Visit: Payer: 59 | Admitting: Dermatology

## 2023-06-14 ENCOUNTER — Ambulatory Visit (INDEPENDENT_AMBULATORY_CARE_PROVIDER_SITE_OTHER): Payer: Self-pay | Admitting: Dermatology

## 2023-06-14 DIAGNOSIS — L988 Other specified disorders of the skin and subcutaneous tissue: Secondary | ICD-10-CM

## 2023-06-14 NOTE — Progress Notes (Signed)
   Follow-Up Visit   Subjective  Janet Nielsen is a 52 y.o. female who presents for the following: Botox for facial elastosis. Patient is happy with dosage injected at last appointment and would like same for today, except we are not going to inject the forehead at all except for the Botox Commas areas   The following portions of the chart were reviewed this encounter and updated as appropriate: medications, allergies, medical history  Review of Systems:  No other skin or systemic complaints except as noted in HPI or Assessment and Plan.  Objective  Well appearing patient in no apparent distress; mood and affect are within normal limits.  A focused examination was performed of the face.  Relevant physical exam findings are noted in the Assessment and Plan.  Before botox photos if first time, Injection map photo    Assessment & Plan   Elastosis of skin   Facial Elastosis  Crows Feet  -  7.5 units each side Left botox comma -  2.5 units Right botox comma - 1.25 units Frown Complex - 30 units 48.75 total units injected today   Patient may want areas at forehead treated at next follow up.  The forehead not treated today.   Location: See attached image  Informed consent: Discussed risks (infection, pain, bleeding, bruising, swelling, allergic reaction, paralysis of nearby muscles, eyelid droop, double vision, neck weakness, difficulty breathing, headache, undesirable cosmetic result, and need for additional treatment) and benefits of the procedure, as well as the alternatives.  Informed consent was obtained.  Preparation: The area was cleansed with alcohol.  Procedure Details:  Botox was injected into the dermis with a 30-gauge needle. Pressure applied to any bleeding. Ice packs offered for swelling.  Lot Number:  [id number] Expiration:  04/2025  Total Units Injected: 48.75 units   Plan: Tylenol may be used for headache.  Allow 2 weeks before returning to clinic for  additional dosing as needed. Patient will call for any problems.  Return for 3 - 4 month botox .  IAsher Muir, CMA, am acting as scribe for Armida Sans, MD.   Documentation: I have reviewed the above documentation for accuracy and completeness, and I agree with the above.  Armida Sans, MD

## 2023-06-14 NOTE — Patient Instructions (Addendum)

## 2023-06-25 ENCOUNTER — Encounter: Payer: Self-pay | Admitting: Dermatology

## 2023-08-19 ENCOUNTER — Other Ambulatory Visit (HOSPITAL_COMMUNITY): Payer: Self-pay | Admitting: Family Medicine

## 2023-08-19 ENCOUNTER — Ambulatory Visit (HOSPITAL_COMMUNITY)
Admission: RE | Admit: 2023-08-19 | Discharge: 2023-08-19 | Disposition: A | Discharge status: Home / Self Care | Payer: 59 | Source: Ambulatory Visit | Attending: Family Medicine | Admitting: Family Medicine

## 2023-08-19 DIAGNOSIS — R52 Pain, unspecified: Secondary | ICD-10-CM

## 2023-09-05 ENCOUNTER — Ambulatory Visit: Payer: No Typology Code available for payment source | Admitting: Dermatology

## 2023-09-28 ENCOUNTER — Ambulatory Visit
Admission: RE | Admit: 2023-09-28 | Discharge: 2023-09-28 | Disposition: A | Discharge status: Home / Self Care | Payer: 59 | Source: Ambulatory Visit | Attending: Family Medicine | Admitting: Family Medicine

## 2023-09-28 VITALS — BP 143/98 | HR 82 | Temp 99.2°F | Resp 16

## 2023-09-28 DIAGNOSIS — R197 Diarrhea, unspecified: Secondary | ICD-10-CM | POA: Diagnosis not present

## 2023-09-28 DIAGNOSIS — R110 Nausea: Secondary | ICD-10-CM | POA: Diagnosis not present

## 2023-09-28 DIAGNOSIS — R52 Pain, unspecified: Secondary | ICD-10-CM

## 2023-09-28 LAB — POC COVID19/FLU A&B COMBO
Covid Antigen, POC: NEGATIVE
Influenza A Antigen, POC: NEGATIVE
Influenza B Antigen, POC: NEGATIVE

## 2023-09-28 MED ORDER — ONDANSETRON 4 MG PO TBDP: 4.0000 mg | ORAL_TABLET | Freq: Three times a day (TID) | ORAL | 0 refills | Status: DC | PRN

## 2023-09-28 NOTE — ED Provider Notes (Signed)
[facility]    CSN: [id number] Arrival date & time: 09/28/23  0902      History   Chief Complaint Chief Complaint  Patient presents with   Influenza    I have pain in stomach and lower abdomen. Unable to eat due to nausea. Some diarrhea. Achy neck,shoulders, legs and back. Chills. Forehead temp 99.5. - Entered by patient    HPI Janet Nielsen is a 53 y.o. female.   Presenting today with 1 day history of abdominal pain, nausea, fever, body aches, chills, fatigue, diarrhea.  Denies vomiting, chest pain, shortness of breath, sore throat, congestion, cough.  So far taking Pepto-Bismol and Tylenol with minimal relief.  Tolerating fluids well.  No known new foods, new medications, recent sick contacts.    Past Medical History:  Diagnosis Date   Allergic rhinitis    Joint pain     Patient Active Problem List   Diagnosis Date Noted   Rabies exposure 03/19/2011    Past Surgical History:  Procedure Laterality Date   BREAST REDUCTION SURGERY     COLONOSCOPY WITH PROPOFOL N/A 07/07/2021   Procedure: COLONOSCOPY WITH PROPOFOL;  Surgeon: Lanelle Bal, DO;  Location: [facility];  Service: Endoscopy;  Laterality: N/A;  10:15am   POLYPECTOMY  07/07/2021   Procedure: POLYPECTOMY;  Surgeon: Lanelle Bal, DO;  Location: [facility];  Service: Endoscopy;;   TUBAL LIGATION      OB History   No obstetric history on file.      Home Medications    Prior to Admission medications   Medication Sig Start Date End Date Taking? Authorizing Provider  ondansetron (ZOFRAN-ODT) 4 MG disintegrating tablet Take 1 tablet (4 mg total) by mouth every 8 (eight) hours as needed for nausea or vomiting. 09/28/23  Yes Particia Nearing, PA-C  ALPRAZolam Prudy Feeler) 0.5 MG tablet Take 0.25 mg by mouth 3 (three) times daily. Patient not taking: Reported on 06/14/2023 06/03/23   [provider]  bisacodyl (DULCOLAX) 5 MG EC tablet Take 5 mg by mouth daily.    [provider]  celecoxib (CELEBREX) 200 MG capsule Take 200-400 mg by mouth daily as needed for moderate pain. 05/15/21   [provider]  clobetasol cream (TEMOVATE) 0.05 % Apply topically to aa of bug bites twice daily for 2 weeks prn. Avoid applying to face, groin, and axilla. Use as directed. Long-term use can cause thinning of the skin. 03/10/23   Deirdre Evener, MD  cyclobenzaprine (FLEXERIL) 10 MG tablet Take 10 mg by mouth 3 (three) times daily. 02/16/23   [provider]  fluticasone (FLONASE) 50 MCG/ACT nasal spray Place 1 spray into both nostrils daily. 04/02/21   [provider]  ibuprofen (ADVIL,MOTRIN) 200 MG tablet Take 200-800 mg by mouth every 8 (eight) hours as needed for moderate pain. For headache pain    [provider]  meloxicam (MOBIC) 15 MG tablet Take 1 tablet (15 mg total) by mouth daily. 12/21/21   McDonald, Rachelle Hora, DPM  mupirocin ointment (BACTROBAN) 2 % Apply to any open sores/wounds or scabs on body twice daily until healed. 03/10/23   Deirdre Evener, MD  Omega-3 Fatty Acids (FISH OIL PO) Take 2 capsules by mouth daily.    [provider]  polyethylene glycol-electrolytes (TRILYTE) 420 g solution Take 4,000 mLs by mouth as directed. 06/10/21   Lanelle Bal, DO  rosuvastatin (CRESTOR) 5 MG tablet Take 5 mg by mouth daily. 04/14/23  [provider]  valACYclovir (VALTREX) 500 MG tablet Take 1,000 mg by mouth daily as needed (fever blisters). 05/15/21   [provider]    Family History Family History  Problem Relation Age of Onset   High Cholesterol Mother    High Cholesterol Sister     Social History Social History   Tobacco Use   Smoking status: Never   Smokeless tobacco: Never  Substance Use Topics   Alcohol use: Yes    Comment: every weekend   Drug use: No     Allergies   Patient has no known allergies.   Review of Systems Review of Systems PER HPI  Physical Exam Triage Vital  Signs ED Triage Vitals  Encounter Vitals Group     BP 09/28/23 0939 (!) 143/98     Systolic BP Percentile --      Diastolic BP Percentile --      Pulse Rate 09/28/23 0939 82     Resp 09/28/23 0939 16     Temp 09/28/23 0939 99.2 F (37.3 C)     Temp Source 09/28/23 0939 Oral     SpO2 09/28/23 0939 97 %     Weight --      Height --      Head Circumference --      Peak Flow --      Pain Score 09/28/23 0940 0     Pain Loc --      Pain Education --      Exclude from Growth Chart --    No data found.  Updated Vital Signs BP (!) 143/98 (BP Location: Right Arm)   Pulse 82   Temp 99.2 F (37.3 C) (Oral)   Resp 16   LMP 03/09/2011   SpO2 97%   Visual Acuity Right Eye Distance:   Left Eye Distance:   Bilateral Distance:    Right Eye Near:   Left Eye Near:    Bilateral Near:     Physical Exam Vitals and nursing note reviewed.  Constitutional:      Appearance: Normal appearance. She is not ill-appearing.  HENT:     Head: Atraumatic.     Nose: Nose normal.     Mouth/Throat:     Mouth: Mucous membranes are moist.     Pharynx: Oropharynx is clear.  Eyes:     Extraocular Movements: Extraocular movements intact.     Conjunctiva/sclera: Conjunctivae normal.  Cardiovascular:     Rate and Rhythm: Normal rate and regular rhythm.     Heart sounds: Normal heart sounds.  Pulmonary:     Effort: Pulmonary effort is normal.     Breath sounds: Normal breath sounds.  Abdominal:     General: Bowel sounds are normal. There is no distension.     Palpations: Abdomen is soft.     Tenderness: There is no abdominal tenderness. There is no right CVA tenderness, left CVA tenderness or guarding.  Musculoskeletal:        General: Normal range of motion.     Cervical back: Normal range of motion and neck supple.  Skin:    General: Skin is warm and dry.  Neurological:     Mental Status: She is alert and oriented to person, place, and time.  Psychiatric:        Mood and Affect: Mood  normal.        Thought Content: Thought content normal.        Judgment: Judgment normal.  UC Treatments / Results  Labs (all labs ordered are listed, but only abnormal results are displayed) Labs Reviewed  POC COVID19/FLU A&B COMBO    EKG   Radiology No results found.  Procedures Procedures (including critical care time)  Medications Ordered in UC Medications - No data to display  Initial Impression / Assessment and Plan / UC Course  I have reviewed the triage vital signs and the nursing notes.  Pertinent labs & imaging results that were available during my care of the patient were reviewed by me and considered in my medical decision making (see chart for details).     Vital signs and exam reassuring with no red flag findings.  COVID and flu testing negative, suspect viral GI illness.  Treat with Zofran, brat diet, fluids, supportive home care.  Return for worsening symptoms.  Work note given.  Final Clinical Impressions(s) / UC Diagnoses   Final diagnoses:  Nausea without vomiting  Diarrhea, unspecified type  Generalized body aches   Discharge Instructions   None    ED Prescriptions     Medication Sig Dispense Auth. Provider   ondansetron (ZOFRAN-ODT) 4 MG disintegrating tablet Take 1 tablet (4 mg total) by mouth every 8 (eight) hours as needed for nausea or vomiting. 20 tablet Particia Nearing, [place]      PDMP not reviewed this encounter.   Particia Nearing, [place] 09/28/23 1024

## 2023-09-28 NOTE — ED Triage Notes (Signed)
Pt states abdominal pain nausea,fever,body aches and chills since yesterday.  States she took Advil,Pepto  and Nyquil at home.

## 2023-12-01 ENCOUNTER — Encounter: Payer: Self-pay | Admitting: Dermatology

## 2023-12-01 ENCOUNTER — Ambulatory Visit: Payer: PRIVATE HEALTH INSURANCE | Admitting: Dermatology

## 2023-12-01 DIAGNOSIS — D1801 Hemangioma of skin and subcutaneous tissue: Secondary | ICD-10-CM

## 2023-12-01 DIAGNOSIS — D239 Other benign neoplasm of skin, unspecified: Secondary | ICD-10-CM

## 2023-12-01 DIAGNOSIS — L814 Other melanin hyperpigmentation: Secondary | ICD-10-CM

## 2023-12-01 DIAGNOSIS — D229 Melanocytic nevi, unspecified: Secondary | ICD-10-CM

## 2023-12-01 DIAGNOSIS — L578 Other skin changes due to chronic exposure to nonionizing radiation: Secondary | ICD-10-CM | POA: Diagnosis not present

## 2023-12-01 DIAGNOSIS — Z1283 Encounter for screening for malignant neoplasm of skin: Secondary | ICD-10-CM | POA: Diagnosis not present

## 2023-12-01 DIAGNOSIS — L821 Other seborrheic keratosis: Secondary | ICD-10-CM

## 2023-12-01 DIAGNOSIS — D225 Melanocytic nevi of trunk: Secondary | ICD-10-CM

## 2023-12-01 DIAGNOSIS — D489 Neoplasm of uncertain behavior, unspecified: Secondary | ICD-10-CM

## 2023-12-01 DIAGNOSIS — L739 Follicular disorder, unspecified: Secondary | ICD-10-CM | POA: Diagnosis not present

## 2023-12-01 DIAGNOSIS — W908XXA Exposure to other nonionizing radiation, initial encounter: Secondary | ICD-10-CM

## 2023-12-01 DIAGNOSIS — L988 Other specified disorders of the skin and subcutaneous tissue: Secondary | ICD-10-CM

## 2023-12-01 DIAGNOSIS — Z808 Family history of malignant neoplasm of other organs or systems: Secondary | ICD-10-CM

## 2023-12-01 DIAGNOSIS — D492 Neoplasm of unspecified behavior of bone, soft tissue, and skin: Secondary | ICD-10-CM | POA: Diagnosis not present

## 2023-12-01 DIAGNOSIS — L719 Rosacea, unspecified: Secondary | ICD-10-CM

## 2023-12-01 DIAGNOSIS — Z7189 Other specified counseling: Secondary | ICD-10-CM

## 2023-12-01 HISTORY — DX: Other benign neoplasm of skin, unspecified: D23.9

## 2023-12-01 NOTE — Patient Instructions (Addendum)

## 2023-12-01 NOTE — Progress Notes (Signed)
 Follow-Up Visit   Subjective  Janet Nielsen is a 53 y.o. female who presents for the following: Skin Cancer Screening and Full Body Skin Exam And botox today   The patient presents for Total-Body Skin Exam (TBSE) for skin cancer screening and mole check. The patient has spots, moles and lesions to be evaluated, some may be new or changing and the patient may have concern these could be cancer.  The following portions of the chart were reviewed this encounter and updated as appropriate: medications, allergies, medical history  Review of Systems:  No other skin or systemic complaints except as noted in HPI or Assessment and Plan.  Objective  Well appearing patient in no apparent distress; mood and affect are within normal limits.  A full examination was performed including scalp, head, eyes, ears, nose, lips, neck, chest, axillae, abdomen, back, buttocks, bilateral upper extremities, bilateral lower extremities, hands, feet, fingers, toes, fingernails, and toenails. All findings within normal limits unless otherwise noted below.   Relevant physical exam findings are noted in the Assessment and Plan.     right upper back paraspinal 0.6 cm irregular brown macule    Assessment & Plan   SKIN CANCER SCREENING PERFORMED TODAY.  ACTINIC DAMAGE - Chronic condition, secondary to cumulative UV/sun exposure - diffuse scaly erythematous macules with underlying dyspigmentation - Recommend daily broad spectrum sunscreen SPF 30+ to sun-exposed areas, reapply every 2 hours as needed.  - Staying in the shade or wearing long sleeves, sun glasses (UVA+UVB protection) and wide brim hats (4-inch brim around the entire circumference of the hat) are also recommended for sun protection.  - Call for new or changing lesions.  LENTIGINES, SEBORRHEIC KERATOSES, HEMANGIOMAS - Benign normal skin lesions - Benign-appearing - Call for any changes  MELANOCYTIC NEVI - Tan-brown and/or pink-flesh-colored  symmetric macules and papules - Benign appearing on exam today - Observation - Call clinic for new or changing moles - Recommend daily use of broad spectrum spf 30+ sunscreen to sun-exposed areas.   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 Condition is symptomatic/ bothersome to patient. Not currently at goal. Rosacea is a chronic progressive skin condition usually affecting the face of adults, causing redness and/or acne bumps. It is treatable but not curable. It sometimes affects the eyes (ocular rosacea) as well. It may respond to topical and/or systemic medication and can flare with stress, sun exposure, alcohol, exercise, topical steroids (including hydrocortisone/cortisone 10) and some foods.  Daily application of broad spectrum spf 30+ sunscreen to face is recommended to reduce flares.  Patient denies grittiness of the eyes  Treatment Plan No recommended prescription treatment   Counseling for BBL / IPL / Laser and Coordination of Care Discussed the treatment option of Broad Band Light (BBL) /Intense Pulsed Light (IPL)/ Laser for skin discoloration, including brown spots and redness.  Typically we recommend at least 1-3 treatment sessions about 5-8 weeks apart for best results.  Cannot have tanned skin when BBL performed, and regular use of sunscreen/photoprotection is advised after the procedure to help maintain results. The patient's condition may also require "maintenance treatments" in the future.  The fee for BBL / laser treatments is $350 per treatment session for the whole face.  A fee can be quoted for other parts of the body.  Insurance typically does not pay for  BBL/laser treatments and therefore the fee is an out-of-pocket cost. Recommend prophylactic valtrex treatment. Once scheduled for  procedure, will send Rx in prior to patient's appointment.   FACIAL ELASTOSIS Exam: Rhytides and volume loss. Botox today   Treatment Plan: Facial Elastosis Crow's Feet 7.5 each side Left botox comma 2.5 units, Rt botox comma 1.25 units  Frown Complex 30 units Total 48.75 units   Location:  Informed consent: Discussed risks (infection, pain, bleeding, bruising, swelling, allergic reaction, paralysis of nearby muscles, eyelid droop, double vision, neck weakness, difficulty breathing, headache, undesirable cosmetic result, and need for additional treatment) and benefits of the procedure, as well as the alternatives.  Informed consent was obtained.  Preparation: The area was cleansed with alcohol.  Procedure Details:  Botox was injected into the dermis with a 30-gauge needle. Pressure applied to any bleeding. Ice packs offered for swelling.  Lot Number:   [id number] Expiration:  09/2025  Total Units Injected:  48.75 units of botox   Plan: Tylenol may be used for headache.  Allow 2 weeks before returning to clinic for additional dosing as needed. Patient will call for any problems.  Recommend daily broad spectrum sunscreen SPF 30+ to sun-exposed areas, reapply every 2 hours as needed. Call for new or changing lesions.  Staying in the shade or wearing long sleeves, sun glasses (UVA+UVB protection) and wide brim hats (4-inch brim around the entire circumference of the hat) are also recommended for sun protection.   NEOPLASM OF UNCERTAIN BEHAVIOR right upper back paraspinal Epidermal / dermal shaving  Lesion diameter (cm):  0.6 Informed consent: discussed and consent obtained   Timeout: patient name, date of birth, surgical site, and procedure verified   Procedure prep:  Patient was prepped and draped in usual sterile fashion Prep type:  Isopropyl alcohol Anesthesia: the lesion was anesthetized in a standard fashion   Anesthetic:  1% lidocaine w/ epinephrine 1-100,000 buffered w/ 8.4%  NaHCO3 Instrument used: flexible razor blade   Hemostasis achieved with: pressure, aluminum chloride and electrodesiccation   Outcome: patient tolerated procedure well   Post-procedure details: sterile dressing applied and wound care instructions given   Dressing type: bandage and petrolatum   Specimen 1 - Surgical pathology Differential Diagnosis: nevus r/o dysplasia   Check Margins: yes Nevus r/o dysplasia  Return in about 1 year (around 11/30/2024) for TBSE.  IRandee Busing, CMA, am acting as scribe for Celine Collard, MD.  Documentation: I have reviewed the above documentation for accuracy and completeness, and I agree with the above.  Celine Collard, MD

## 2023-12-09 LAB — SURGICAL PATHOLOGY

## 2023-12-12 ENCOUNTER — Encounter: Payer: Self-pay | Admitting: Dermatology

## 2023-12-13 ENCOUNTER — Telehealth: Payer: Self-pay

## 2023-12-13 NOTE — Telephone Encounter (Addendum)
 Called and discussed results with patient. She verbalized understanding and denied further questions. Will Recheck at next follow up.----- Message from Celine Collard sent at 12/12/2023  6:25 PM EDT ----- FINAL DIAGNOSIS        1. 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

## 2024-01-02 ENCOUNTER — Ambulatory Visit: Payer: No Typology Code available for payment source | Admitting: Dermatology

## 2024-01-31 ENCOUNTER — Other Ambulatory Visit (HOSPITAL_BASED_OUTPATIENT_CLINIC_OR_DEPARTMENT_OTHER): Payer: Self-pay | Admitting: Family Medicine

## 2024-01-31 DIAGNOSIS — E7849 Other hyperlipidemia: Secondary | ICD-10-CM

## 2024-02-28 ENCOUNTER — Ambulatory Visit (HOSPITAL_COMMUNITY)
Admission: RE | Admit: 2024-02-28 | Discharge: 2024-02-28 | Disposition: A | Discharge status: Home / Self Care | Payer: Self-pay | Source: Ambulatory Visit | Attending: Family Medicine | Admitting: Family Medicine

## 2024-02-28 DIAGNOSIS — E7849 Other hyperlipidemia: Secondary | ICD-10-CM | POA: Insufficient documentation

## 2024-04-03 ENCOUNTER — Encounter: Payer: Self-pay | Admitting: Dermatology

## 2024-04-03 ENCOUNTER — Ambulatory Visit (INDEPENDENT_AMBULATORY_CARE_PROVIDER_SITE_OTHER): Payer: 59 | Admitting: Dermatology

## 2024-04-03 DIAGNOSIS — Z86018 Personal history of other benign neoplasm: Secondary | ICD-10-CM | POA: Diagnosis not present

## 2024-04-03 DIAGNOSIS — L988 Other specified disorders of the skin and subcutaneous tissue: Secondary | ICD-10-CM

## 2024-04-03 NOTE — Progress Notes (Signed)
   Follow-Up Visit   Subjective  Janet Nielsen is a 53 y.o. female who presents for the following: Botox  to face today and recheck Dysplastic Nevus moderate atypia R upper back paraspinal, bx 12/01/23  The following portions of the chart were reviewed this encounter and updated as appropriate: medications, allergies, medical history  Review of Systems:  No other skin or systemic complaints except as noted in HPI or Assessment and Plan.  Objective  Well appearing patient in no apparent distress; mood and affect are within normal limits.  A focused examination was performed of the following areas: Back, face  Relevant exam findings are noted in the Assessment and Plan.  Botox injection map    Assessment & Plan   FACIAL ELASTOSIS Exam: Rhytides and volume loss.  Treatment Plan: Facial Elastosis  Botox 48.75 units injected today to: - Frown complex 30 units - L botox comma 2.5 units - R botox comma 1.25 units - Crow's feet 7.5 units x 2  Location: frown complex, bil botox commas, bil crow's feet  Informed consent: Discussed risks (infection, pain, bleeding, bruising, swelling, allergic reaction, paralysis of nearby muscles, eyelid droop, double vision, neck weakness, difficulty breathing, headache, undesirable cosmetic result, and need for additional treatment) and benefits of the procedure, as well as the alternatives.  Informed consent was obtained.  Preparation: The area was cleansed with alcohol.  Procedure Details:  Botox was injected into the dermis with a 30-gauge needle. Pressure applied to any bleeding. Ice packs offered for swelling.  Lot Number:  [id number] Expiration:  12/2025  Total Units Injected:  48.75  Plan: Tylenol may be used for headache.  Allow 2 weeks before returning to clinic for additional dosing as needed. Patient will call for any problems.   Recommend daily broad spectrum sunscreen SPF 30+ to sun-exposed areas, reapply every 2 hours as  needed. Call for new or changing lesions.  Staying in the shade or wearing long sleeves, sun glasses (UVA+UVB protection) and wide brim hats (4-inch brim around the entire circumference of the hat) are also recommended for sun protection.   HISTORY OF DYSPLASTIC NEVUS No evidence of recurrence today Recommend regular full body skin exams Recommend daily broad spectrum sunscreen SPF 30+ to sun-exposed areas, reapply every 2 hours as needed.  Call if any new or changing lesions are noted between office visits  - R upper back paraspinal clear today   Return in about 9 months (around 01/01/2025) for TBSE and Botox , Hx of Dysplastic nevi.  I, Grayce Saunas, RMA, am acting as scribe for Alm Rhyme, MD .   Documentation: I have reviewed the above documentation for accuracy and completeness, and I agree with the above.  Alm Rhyme, MD

## 2024-04-03 NOTE — Patient Instructions (Signed)

## 2024-08-21 ENCOUNTER — Encounter: Payer: Self-pay | Admitting: Dermatology

## 2024-08-21 ENCOUNTER — Ambulatory Visit (INDEPENDENT_AMBULATORY_CARE_PROVIDER_SITE_OTHER): Payer: 59 | Admitting: Dermatology

## 2024-08-21 DIAGNOSIS — L988 Other specified disorders of the skin and subcutaneous tissue: Secondary | ICD-10-CM

## 2024-08-21 NOTE — Patient Instructions (Signed)

## 2024-08-21 NOTE — Progress Notes (Signed)
" ° °  Follow-Up Visit   Subjective  Janet Nielsen is a 54 y.o. female who presents for the following: Botox for facial elastosis  The following portions of the chart were reviewed this encounter and updated as appropriate: medications, allergies, medical history  Review of Systems:  No other skin or systemic complaints except as noted in HPI or Assessment and Plan.  Objective  Well appearing patient in no apparent distress; mood and affect are within normal limits.  A focused examination was performed of the face.  Relevant physical exam findings are noted in the Assessment and Plan.  Injection map photo     Assessment & Plan    Facial Elastosis Botox 48.75 units injected today to: - Frown complex 30 units - L botox comma 2.5 units - R botox comma 1.25 units - Crows feet 7.5 units x 2 Location: crown complex, botox commas, crows feet  Informed consent: Discussed risks (infection, pain, bleeding, bruising, swelling, allergic reaction, paralysis of nearby muscles, eyelid droop, double vision, neck weakness, difficulty breathing, headache, undesirable cosmetic result, and need for additional treatment) and benefits of the procedure, as well as the alternatives.  Informed consent was obtained.  Preparation: The area was cleansed with alcohol.  Procedure Details:  Botox was injected into the dermis with a 30-gauge needle. Pressure applied to any bleeding. Ice packs offered for swelling.  Lot Number:  [id number] Expiration:  07/2026  Total Units Injected:  48.75  Plan: Tylenol may be used for headache.  Allow 2 weeks before returning to clinic for additional dosing as needed. Patient will call for any problems.  Return for 3-57m Botox.  I, Grayce Saunas, RMA, am acting as scribe for Alm Rhyme, MD .   Documentation: I have reviewed the above documentation for accuracy and completeness, and I agree with the above.  Alm Rhyme, MD      "

## 2024-12-19 ENCOUNTER — Ambulatory Visit: Admitting: Dermatology

## 2025-04-16 ENCOUNTER — Ambulatory Visit: Admitting: Dermatology
